# Patient Record
Sex: Female | Born: 1955 | Race: Black or African American | Hispanic: No | Marital: Single | Smoking: Current every day smoker
Health system: Southern US, Community
[De-identification: ages and names within clinical notes are randomized; demographics above are authoritative.]

## PROBLEM LIST (undated history)

## (undated) DIAGNOSIS — I10 Essential (primary) hypertension: Secondary | ICD-10-CM

## (undated) DIAGNOSIS — J449 Chronic obstructive pulmonary disease, unspecified: Secondary | ICD-10-CM

## (undated) HISTORY — PX: ENDOSCOPIC STENT PLACEMENT W/ MLB: SHX1507

---

## 2007-11-29 ENCOUNTER — Emergency Department (HOSPITAL_BASED_OUTPATIENT_CLINIC_OR_DEPARTMENT_OTHER): Admission: EM | Admit: 2007-11-29 | Discharge: 2007-11-29 | Payer: Self-pay | Admitting: Emergency Medicine

## 2009-03-13 ENCOUNTER — Emergency Department (HOSPITAL_BASED_OUTPATIENT_CLINIC_OR_DEPARTMENT_OTHER): Admission: EM | Admit: 2009-03-13 | Discharge: 2009-03-13 | Payer: Self-pay | Admitting: Emergency Medicine

## 2009-03-13 ENCOUNTER — Ambulatory Visit: Payer: Self-pay | Admitting: Interventional Radiology

## 2009-12-18 ENCOUNTER — Emergency Department (HOSPITAL_BASED_OUTPATIENT_CLINIC_OR_DEPARTMENT_OTHER): Admission: EM | Admit: 2009-12-18 | Discharge: 2009-12-18 | Payer: Self-pay | Admitting: Emergency Medicine

## 2009-12-18 ENCOUNTER — Ambulatory Visit: Payer: Self-pay | Admitting: Diagnostic Radiology

## 2010-04-25 LAB — WET PREP, GENITAL: Yeast Wet Prep HPF POC: NONE SEEN

## 2010-04-25 LAB — PROTIME-INR
INR: 1.88 — ABNORMAL HIGH (ref 0.00–1.49)
Prothrombin Time: 21.8 seconds — ABNORMAL HIGH (ref 11.6–15.2)

## 2010-04-25 LAB — BASIC METABOLIC PANEL
BUN: 11 mg/dL (ref 6–23)
Calcium: 9.8 mg/dL (ref 8.4–10.5)
Creatinine, Ser: 0.8 mg/dL (ref 0.4–1.2)
GFR calc Af Amer: >60 mL/min (ref >60)
GFR calc non Af Amer: >60 mL/min (ref >60)

## 2010-04-25 LAB — CBC
Platelets: 292 10*3/uL (ref 150–400)
RBC: 4.98 MIL/uL (ref 3.87–5.11)
RDW: 13.1 % (ref 11.5–15.5)
WBC: 10.9 10*3/uL — ABNORMAL HIGH (ref 4.0–10.5)

## 2010-04-25 LAB — DIFFERENTIAL
Basophils Absolute: 0.1 10*3/uL (ref 0.0–0.1)
Eosinophils Relative: 1 % (ref 0–5)
Lymphocytes Relative: 25 % (ref 12–46)
Lymphs Abs: 2.7 10*3/uL (ref 0.7–4.0)
Neutro Abs: 7.4 10*3/uL (ref 1.7–7.7)
Neutrophils Relative %: 68 % (ref 43–77)

## 2010-04-25 LAB — URINALYSIS, ROUTINE W REFLEX MICROSCOPIC
Bilirubin Urine: NEGATIVE
Hgb urine dipstick: NEGATIVE
Ketones, ur: NEGATIVE mg/dL
Ketones, ur: NEGATIVE mg/dL
Nitrite: NEGATIVE
Nitrite: NEGATIVE
Protein, ur: NEGATIVE mg/dL
Specific Gravity, Urine: 1.014 (ref 1.005–1.030)
Urobilinogen, UA: 0.2 mg/dL (ref 0.0–1.0)

## 2010-04-25 LAB — PREGNANCY, URINE: Preg Test, Ur: NEGATIVE

## 2010-04-25 LAB — GC/CHLAMYDIA PROBE AMP, GENITAL: Chlamydia, DNA Probe: NEGATIVE

## 2010-04-25 LAB — URINE MICROSCOPIC-ADD ON

## 2010-05-01 LAB — CBC
HCT: 41 % (ref 36.0–46.0)
Hemoglobin: 13.8 g/dL (ref 12.0–15.0)
MCHC: 33.5 g/dL (ref 30.0–36.0)
MCV: 88 fL (ref 78.0–100.0)
Platelets: 261 10*3/uL (ref 150–400)
RBC: 4.67 MIL/uL (ref 3.87–5.11)
RDW: 13.9 % (ref 11.5–15.5)
WBC: 13.5 10*3/uL — ABNORMAL HIGH (ref 4.0–10.5)

## 2010-05-01 LAB — BASIC METABOLIC PANEL WITH GFR
Calcium: 9 mg/dL (ref 8.4–10.5)
Creatinine, Ser: 0.8 mg/dL (ref 0.4–1.2)
GFR calc Af Amer: >60 mL/min (ref >60)
GFR calc non Af Amer: >60 mL/min (ref >60)
Glucose, Bld: 110 mg/dL — ABNORMAL HIGH (ref 70–99)
Sodium: 140 meq/L (ref 135–145)

## 2010-05-01 LAB — POCT I-STAT 3, VENOUS BLOOD GAS (G3P V)
Bicarbonate: 24.1 meq/L — ABNORMAL HIGH (ref 20.0–24.0)
O2 Saturation: 82 %
Patient temperature: 101.2
TCO2: 25 mmol/L (ref 0–100)
pCO2, Ven: 39.5 mmHg — ABNORMAL LOW (ref 45.0–50.0)
pH, Ven: 7.399 — ABNORMAL HIGH (ref 7.250–7.300)
pO2, Ven: 50 mmHg — ABNORMAL HIGH (ref 30.0–45.0)

## 2010-05-01 LAB — DIFFERENTIAL
Basophils Absolute: 0.1 K/uL (ref 0.0–0.1)
Basophils Relative: 1 % (ref 0–1)
Eosinophils Absolute: 0 10*3/uL (ref 0.0–0.7)
Eosinophils Relative: 0 % (ref 0–5)
Lymphocytes Relative: 9 % — ABNORMAL LOW (ref 12–46)
Lymphs Abs: 1.2 10*3/uL (ref 0.7–4.0)
Monocytes Absolute: 0.7 K/uL (ref 0.1–1.0)
Monocytes Relative: 6 % (ref 3–12)
Neutro Abs: 11.5 K/uL — ABNORMAL HIGH (ref 1.7–7.7)
Neutrophils Relative %: 85 % — ABNORMAL HIGH (ref 43–77)

## 2010-05-01 LAB — CULTURE, BLOOD (ROUTINE X 2)
Culture: NO GROWTH
Culture: NO GROWTH

## 2010-05-01 LAB — BASIC METABOLIC PANEL
BUN: 10 mg/dL (ref 6–23)
CO2: 29 mEq/L (ref 19–32)
Chloride: 105 mEq/L (ref 96–112)
Potassium: 3.8 mEq/L (ref 3.5–5.1)

## 2010-05-01 LAB — LACTIC ACID, PLASMA: Lactic Acid, Venous: 2 mmol/L (ref 0.5–2.2)

## 2010-12-17 ENCOUNTER — Emergency Department (HOSPITAL_BASED_OUTPATIENT_CLINIC_OR_DEPARTMENT_OTHER)
Admission: EM | Admit: 2010-12-17 | Discharge: 2010-12-17 | Disposition: A | Discharge status: Home / Self Care | Payer: BC Managed Care – PPO | Attending: Emergency Medicine | Admitting: Emergency Medicine

## 2010-12-17 ENCOUNTER — Encounter: Payer: Self-pay | Admitting: *Deleted

## 2010-12-17 DIAGNOSIS — I10 Essential (primary) hypertension: Secondary | ICD-10-CM | POA: Insufficient documentation

## 2010-12-17 DIAGNOSIS — F17200 Nicotine dependence, unspecified, uncomplicated: Secondary | ICD-10-CM | POA: Insufficient documentation

## 2010-12-17 DIAGNOSIS — E119 Type 2 diabetes mellitus without complications: Secondary | ICD-10-CM | POA: Insufficient documentation

## 2010-12-17 DIAGNOSIS — M25539 Pain in unspecified wrist: Secondary | ICD-10-CM | POA: Insufficient documentation

## 2010-12-17 DIAGNOSIS — M79609 Pain in unspecified limb: Secondary | ICD-10-CM | POA: Insufficient documentation

## 2010-12-17 DIAGNOSIS — M25532 Pain in left wrist: Secondary | ICD-10-CM

## 2010-12-17 DIAGNOSIS — Z79899 Other long term (current) drug therapy: Secondary | ICD-10-CM | POA: Insufficient documentation

## 2010-12-17 HISTORY — DX: Essential (primary) hypertension: I10

## 2010-12-17 MED ORDER — IBUPROFEN 600 MG PO TABS: 600.0000 mg | ORAL_TABLET | Freq: Four times a day (QID) | ORAL | Status: AC | PRN

## 2010-12-17 MED ORDER — OXYCODONE-ACETAMINOPHEN 5-325 MG PO TABS: 2.0000 | ORAL_TABLET | ORAL | Status: AC | PRN

## 2010-12-17 MED ORDER — IBUPROFEN 600 MG PO TABS: 600.0000 mg | ORAL_TABLET | Freq: Four times a day (QID) | ORAL | Status: DC | PRN

## 2010-12-17 NOTE — ED Provider Notes (Signed)
History  Scribed for Lisa Baker, MD, the patient was seen in [facility]. The chart was scribed by Gilman Schmidt. The patients care was started at 11:16 PM.   CSN: [id number] Arrival date & time: 12/17/2010 10:54 PM   First MD Initiated Contact with Patient 12/17/10 2310      Chief Complaint  Patient presents with  . Hand Pain     HPI Lisa Zamora is a 55 y.o. female with a history of HTN and DM who presents to the Emergency Department complaining of sharp left hand pain. Pt states she woke up a.m. With left hand pain. No known injury Symptoms are exacerbated upon movement. Denies any history of gout. Denies any injury or fever. Pt has never had similar sx. There are no other associated symptoms and no other alleviating or aggravating factors. Patient notes that she has been doing excessive typing recently and feels that this may be an associated factor    Past Medical History  Diagnosis Date  . Hypertension   . Diabetes mellitus     Past Surgical History  Procedure Date  . Endoscopic stent placement w/ mlb     History reviewed. No pertinent family history.  History  Substance Use Topics  . Smoking status: Current Everyday Smoker  . Smokeless tobacco: Not on file  . Alcohol Use: No    OB History    Grav Para Term Preterm Abortions TAB SAB Ect Mult Living                  Review of Systems  Constitutional: Negative for fever.  Musculoskeletal:       Hand pain   All other systems reviewed and are negative.    Allergies  Review of patient's allergies indicates no known allergies.  Home Medications   Current Outpatient Rx  Name Route Sig Dispense Refill  . ACETAMINOPHEN 500 MG PO TABS Oral Take 1,000 mg by mouth once as needed. For pain     . ALBUTEROL 90 MCG/ACT IN AERS Inhalation Inhale 2 puffs into the lungs every 6 (six) hours as needed. For wheezing or shortness of breath     . AMLODIPINE BESYLATE PO Oral Take 1 tablet by mouth daily.      . ASPIRIN 325  MG PO TBEC Oral Take 325 mg by mouth daily.      Marland Kitchen CLOPIDOGREL BISULFATE 75 MG PO TABS Oral Take 75 mg by mouth daily.      Marland Kitchen GABAPENTIN PO Oral Take 4 capsules by mouth at bedtime.      Marland Kitchen HYDROCHLOROTHIAZIDE PO Oral Take 1 tablet by mouth daily.      Marland Kitchen BIOFREEZE EX Apply externally Apply 1 application topically once as needed. For pain     . METFORMIN HCL PO Oral Take 1 tablet by mouth daily.      . IBUPROFEN 600 MG PO TABS Oral Take 1 tablet (600 mg total) by mouth every 6 (six) hours as needed for pain. 30 tablet 0  . OXYCODONE-ACETAMINOPHEN 5-325 MG PO TABS Oral Take 2 tablets by mouth every 4 (four) hours as needed for pain. 10 tablet 0    BP 172/96  Pulse 106  Temp(Src) 97.9 F (36.6 C) (Oral)  Resp 20  Ht 5\' 3"  (1.6 m)  Wt 160 lb (72.576 kg)  BMI 28.34 kg/m2  SpO2 95%  Physical Exam  Constitutional: She is oriented to person, place, and time. She appears well-developed and well-nourished.  Non-toxic appearance. She does  not have a sickly appearance.  HENT:  Head: Normocephalic and atraumatic.  Eyes: Conjunctivae, EOM and lids are normal. Pupils are equal, round, and reactive to light. No scleral icterus.  Neck: Trachea normal and normal range of motion. Neck supple.  Cardiovascular: Regular rhythm and normal heart sounds.   Pulmonary/Chest: Effort normal and breath sounds normal.  Abdominal: Soft. Normal appearance. There is no tenderness. There is no rebound, no guarding and no CVA tenderness.  Musculoskeletal:       Tenderness w/ dorsal wrist Pain w/ flexion/extension No erythema   Neurological: She is alert and oriented to person, place, and time. She has normal strength.  Skin: Skin is warm, dry and intact. No rash noted.    ED Course  Procedures  DIAGNOSTIC STUDIES: Oxygen Saturation is 95% on room air, adequate by my interpretation.      MDM  Patient to be treated with splint and pain medication and followup with Dr. as needed  I personally performed the  services described in this documentation, which was scribed in my presence. The recorded information has been reviewed and considered.        Lisa Baker, MD 12/17/10 2330

## 2010-12-17 NOTE — ED Notes (Signed)
Pt states she woke up a.m. With left hand pain. No known injury.

## 2015-05-15 ENCOUNTER — Emergency Department (HOSPITAL_BASED_OUTPATIENT_CLINIC_OR_DEPARTMENT_OTHER)
Admission: EM | Admit: 2015-05-15 | Discharge: 2015-05-15 | Disposition: A | Discharge status: Home / Self Care | Payer: BC Managed Care – PPO | Attending: Emergency Medicine | Admitting: Emergency Medicine

## 2015-05-15 ENCOUNTER — Emergency Department (HOSPITAL_BASED_OUTPATIENT_CLINIC_OR_DEPARTMENT_OTHER): Payer: BC Managed Care – PPO

## 2015-05-15 ENCOUNTER — Encounter (HOSPITAL_BASED_OUTPATIENT_CLINIC_OR_DEPARTMENT_OTHER): Payer: Self-pay | Admitting: Emergency Medicine

## 2015-05-15 DIAGNOSIS — Z7982 Long term (current) use of aspirin: Secondary | ICD-10-CM | POA: Diagnosis not present

## 2015-05-15 DIAGNOSIS — Z7902 Long term (current) use of antithrombotics/antiplatelets: Secondary | ICD-10-CM | POA: Insufficient documentation

## 2015-05-15 DIAGNOSIS — F17200 Nicotine dependence, unspecified, uncomplicated: Secondary | ICD-10-CM | POA: Diagnosis not present

## 2015-05-15 DIAGNOSIS — E119 Type 2 diabetes mellitus without complications: Secondary | ICD-10-CM | POA: Diagnosis not present

## 2015-05-15 DIAGNOSIS — Z79899 Other long term (current) drug therapy: Secondary | ICD-10-CM | POA: Insufficient documentation

## 2015-05-15 DIAGNOSIS — Z7984 Long term (current) use of oral hypoglycemic drugs: Secondary | ICD-10-CM | POA: Insufficient documentation

## 2015-05-15 DIAGNOSIS — I10 Essential (primary) hypertension: Secondary | ICD-10-CM | POA: Insufficient documentation

## 2015-05-15 DIAGNOSIS — M25532 Pain in left wrist: Secondary | ICD-10-CM | POA: Diagnosis present

## 2015-05-15 MED ORDER — HYDROCODONE-ACETAMINOPHEN 5-325 MG PO TABS: 1.0000 | ORAL_TABLET | ORAL | Status: DC | PRN

## 2015-05-15 MED ORDER — IBUPROFEN 600 MG PO TABS: 600.0000 mg | ORAL_TABLET | Freq: Four times a day (QID) | ORAL | Status: DC | PRN

## 2015-05-15 MED ORDER — IBUPROFEN 600 MG PO TABS
600.0000 mg | ORAL_TABLET | Freq: Four times a day (QID) | ORAL | Status: DC | PRN
Start: 2015-05-15 — End: 2015-05-15

## 2015-05-15 MED ORDER — IBUPROFEN 400 MG PO TABS
600.0000 mg | ORAL_TABLET | Freq: Once | ORAL | Status: AC
  Administered 2015-05-15: 600 mg via ORAL
  Filled 2015-05-15: qty 1

## 2015-05-15 MED ORDER — OXYCODONE-ACETAMINOPHEN 5-325 MG PO TABS: 1.0000 | ORAL_TABLET | Freq: Four times a day (QID) | ORAL | Status: DC | PRN

## 2015-05-15 NOTE — ED Provider Notes (Signed)
CSN: [id number]     Arrival date & time 05/15/15  1100 History   First MD Initiated Contact with Patient 05/15/15 1107     Chief Complaint  Patient presents with  . Wrist Pain     (Consider location/radiation/quality/duration/timing/severity/associated sxs/prior Treatment) Patient is a 60 y.o. female presenting with wrist pain. The history is provided by the patient and medical records. No language interpreter was used.  Wrist Pain Associated symptoms include arthralgias and myalgias. Pertinent negatives include no abdominal pain, congestion, coughing, fever, headaches or joint swelling.  Ellarae Nevitt is a 60 y.o. female  with a PMH of HTN, DM who presents to the Emergency Department complaining of acute onset of worsening, achy left wrist pain with radiation to middle fingers and forearm musculature that began yesterday afternoon. No medications taken prior to arrival for symptoms. Denies injury or trauma. Denies swelling, change in color, fever, recent illness. 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   Past Medical History  Diagnosis Date  . Hypertension   . Diabetes mellitus    Past Surgical History  Procedure Laterality Date  . Endoscopic stent placement w/ mlb     History reviewed. No pertinent family history. Social History  Substance Use Topics  . Smoking status: Current Every Day Smoker  . Smokeless tobacco: None  . Alcohol Use: No   OB History    No data available     Review of Systems  Constitutional: Negative for fever.  HENT: Negative for congestion.   Eyes: Negative for visual disturbance.  Respiratory: Negative for cough and shortness of breath.   Cardiovascular: Negative.   Gastrointestinal: Negative for abdominal pain.  Genitourinary: Negative for dysuria.  Musculoskeletal: Positive for myalgias and arthralgias. Negative for joint swelling.  Skin: Negative for color change.  Neurological: Negative for dizziness and  headaches.      Allergies  Review of patient's allergies indicates no known allergies.  Home Medications   Prior to Admission medications   Medication Sig Start Date End Date Taking? Authorizing Provider  acetaminophen (TYLENOL) 500 MG tablet Take 1,000 mg by mouth once as needed. For pain     Historical Provider, MD  albuterol (PROVENTIL,VENTOLIN) 90 MCG/ACT inhaler Inhale 2 puffs into the lungs every 6 (six) hours as needed. For wheezing or shortness of breath     Historical Provider, MD  AMLODIPINE BESYLATE PO Take 1 tablet by mouth daily.      Historical Provider, MD  aspirin 325 MG EC tablet Take 325 mg by mouth daily.      Historical Provider, MD  clopidogrel (PLAVIX) 75 MG tablet Take 75 mg by mouth daily.      Historical Provider, MD  GABAPENTIN PO Take 4 capsules by mouth at bedtime.      Historical Provider, MD  HYDROCHLOROTHIAZIDE PO Take 1 tablet by mouth daily.      Historical Provider, MD  HYDROcodone-acetaminophen (NORCO/VICODIN) 5-325 MG tablet Take 1 tablet by mouth every 4 (four) hours as needed. 05/15/15   Chase Picket Jaelin Devincentis, PA-C  ibuprofen (ADVIL,MOTRIN) 600 MG tablet Take 1 tablet (600 mg total) by mouth every 6 (six) hours as needed. 05/15/15   Chase Picket Derrill Bagnell, PA-C  Menthol, Topical Analgesic, (BIOFREEZE EX) Apply 1 application topically once as needed. For pain     Historical Provider, MD  METFORMIN HCL PO Take 1 tablet by mouth daily.      Historical Provider, MD   BP 130/72 mmHg  Pulse 94  Temp(Src) 98.6 F (37 C) (Oral)  Resp 18  Ht 5\' 3"  (1.6 m)  Wt 72.576 kg  BMI 28.35 kg/m2  SpO2 100% Physical Exam  Constitutional: She is oriented to person, place, and time. She appears well-developed and well-nourished.  Alert and in no acute distress  HENT:  Head: Normocephalic and atraumatic.  Cardiovascular: Normal rate, regular rhythm and normal heart sounds.  Exam reveals no gallop and no friction rub.   No murmur heard. Pulmonary/Chest: Effort normal and  breath sounds normal. No respiratory distress. She has no wheezes. She has no rales. She exhibits no tenderness.  Musculoskeletal:  No gross deformity noted. Decreased range of motion secondary to pain. Pain with flexion/extension. There is no joint effusion noted. No erythema or warmth overlaying the joint. There is tenderness to palpation over the dorsal wrist. The patient has normal sensation and motor function in the median, ulnar, and radial nerve distributions. There is no anatomic snuff box tenderness. 2+ Radial pulse.  Neurological: She is alert and oriented to person, place, and time.  Skin: Skin is warm and dry.  Nursing note and vitals reviewed.   ED Course  Procedures (including critical care time) Labs Review Labs Reviewed - No data to display  Imaging Review Dg Wrist Complete Left  05/15/2015  CLINICAL DATA:  Acute left wrist pain without known injury. EXAM: LEFT WRIST - COMPLETE 3+ VIEW COMPARISON:  December 18, 2010. FINDINGS: There is no evidence of fracture or dislocation. There is no evidence of arthropathy or other focal bone abnormality. Soft tissues are unremarkable. IMPRESSION: Normal left wrist. Electronically Signed   By: Lupita RaiderJames  Green Jr, M.D.   On: 05/15/2015 11:51   I have personally reviewed and evaluated these images and lab results as part of my medical decision-making.   EKG Interpretation None      MDM   Final diagnoses:  Left wrist pain   Galvin ProfferDonna Meriweather  presents with acute onset of left wrist pain without injury yesterday. Hx of the same once in the past where she was told she had pseudogout. On exam, patient with tenderness to the dorsal wrist. Pain with wrist flexion/extension. No erythema, warmth or swelling of the joint. X-rays were obtained which were unremarkable. Will treat with short course of pain meds and ibuprofen. Wrist splinted in ED. PCP or ortho follow up. Return precautions and home care discussed. All questions answered.   [facility] SystemJaime Pilcher  Esperanza Madrazo, PA-C 05/15/15 1238  Linwood DibblesJon Knapp, MD 05/16/15 [phone]

## 2015-05-15 NOTE — ED Notes (Signed)
Onset yesterday with left wrist pain, no trauma. +radiating to elbow and fingers.   Hx of Pseudogout on right wrist 3 years ago. States this pain feels the same.

## 2015-05-15 NOTE — Discharge Instructions (Signed)
1. Medications: Ibuprofen for mild/moderate pain, take pain medication only as needed for severe pain- This can make you very drowsy - please do not drink or drive on this medication, continue usual home medications 2. Treatment: rest, drink plenty of fluids, ice affected area 3. Follow Up: Please follow up with your primary doctor in 3 days for discussion of your diagnoses and further evaluation after today's visit; Please return to the ER for new or worsening symptoms, any additional concerns.  COLD THERAPY DIRECTIONS:  Ice or gel packs can be used to reduce both pain and swelling. Ice is the most helpful within the first 24 to 48 hours after an injury or flareup from overusing a muscle or joint.  Ice is effective, has very few side effects, and is safe for most people to use.   If you expose your skin to cold temperatures for too long or without the proper protection, you can damage your skin or nerves. Watch for signs of skin damage due to cold.   HOME CARE INSTRUCTIONS  Follow these tips to use ice and cold packs safely.  Place a dry or damp towel between the ice and skin. A damp towel will cool the skin more quickly, so you may need to shorten the time that the ice is used.  For a more rapid response, add gentle compression to the ice.  Ice for no more than 10 to 20 minutes at a time. The bonier the area you are icing, the less time it will take to get the benefits of ice.  Check your skin after 5 minutes to make sure there are no signs of a poor response to cold or skin damage.  Rest 20 minutes or more in between uses.  Once your skin is numb, you can end your treatment. You can test numbness by very lightly touching your skin. The touch should be so light that you do not see the skin dimple from the pressure of your fingertip. When using ice, most people will feel these normal sensations in this order: cold, burning, aching, and numbness.

## 2015-09-15 DIAGNOSIS — I6523 Occlusion and stenosis of bilateral carotid arteries: Secondary | ICD-10-CM | POA: Diagnosis present

## 2015-09-15 DIAGNOSIS — M5116 Intervertebral disc disorders with radiculopathy, lumbar region: Secondary | ICD-10-CM | POA: Insufficient documentation

## 2015-09-15 DIAGNOSIS — I70219 Atherosclerosis of native arteries of extremities with intermittent claudication, unspecified extremity: Secondary | ICD-10-CM | POA: Insufficient documentation

## 2015-10-30 ENCOUNTER — Encounter (HOSPITAL_BASED_OUTPATIENT_CLINIC_OR_DEPARTMENT_OTHER): Payer: Self-pay | Admitting: Emergency Medicine

## 2015-10-30 DIAGNOSIS — X500XXA Overexertion from strenuous movement or load, initial encounter: Secondary | ICD-10-CM | POA: Diagnosis not present

## 2015-10-30 DIAGNOSIS — J449 Chronic obstructive pulmonary disease, unspecified: Secondary | ICD-10-CM | POA: Diagnosis not present

## 2015-10-30 DIAGNOSIS — S39012A Strain of muscle, fascia and tendon of lower back, initial encounter: Secondary | ICD-10-CM | POA: Insufficient documentation

## 2015-10-30 DIAGNOSIS — Z79899 Other long term (current) drug therapy: Secondary | ICD-10-CM | POA: Insufficient documentation

## 2015-10-30 DIAGNOSIS — F17210 Nicotine dependence, cigarettes, uncomplicated: Secondary | ICD-10-CM | POA: Diagnosis not present

## 2015-10-30 DIAGNOSIS — Y929 Unspecified place or not applicable: Secondary | ICD-10-CM | POA: Diagnosis not present

## 2015-10-30 DIAGNOSIS — Y999 Unspecified external cause status: Secondary | ICD-10-CM | POA: Diagnosis not present

## 2015-10-30 DIAGNOSIS — Z791 Long term (current) use of non-steroidal anti-inflammatories (NSAID): Secondary | ICD-10-CM | POA: Insufficient documentation

## 2015-10-30 DIAGNOSIS — I10 Essential (primary) hypertension: Secondary | ICD-10-CM | POA: Diagnosis not present

## 2015-10-30 DIAGNOSIS — Z7984 Long term (current) use of oral hypoglycemic drugs: Secondary | ICD-10-CM | POA: Insufficient documentation

## 2015-10-30 DIAGNOSIS — E119 Type 2 diabetes mellitus without complications: Secondary | ICD-10-CM | POA: Insufficient documentation

## 2015-10-30 DIAGNOSIS — Y939 Activity, unspecified: Secondary | ICD-10-CM | POA: Insufficient documentation

## 2015-10-30 DIAGNOSIS — S3992XA Unspecified injury of lower back, initial encounter: Secondary | ICD-10-CM | POA: Diagnosis present

## 2015-10-30 NOTE — ED Triage Notes (Signed)
Patient reports mid back pain which began Friday.  Unsure if she injured herself but reports history of back pain.

## 2015-10-31 ENCOUNTER — Emergency Department (HOSPITAL_BASED_OUTPATIENT_CLINIC_OR_DEPARTMENT_OTHER)
Admission: EM | Admit: 2015-10-31 | Discharge: 2015-10-31 | Disposition: A | Discharge status: Home / Self Care | Payer: BC Managed Care – PPO | Attending: Emergency Medicine | Admitting: Emergency Medicine

## 2015-10-31 DIAGNOSIS — S39012A Strain of muscle, fascia and tendon of lower back, initial encounter: Secondary | ICD-10-CM

## 2015-10-31 HISTORY — DX: Chronic obstructive pulmonary disease, unspecified: J44.9

## 2015-10-31 MED ORDER — ACETAMINOPHEN 500 MG PO TABS
1000.0000 mg | ORAL_TABLET | Freq: Once | ORAL | Status: AC
  Administered 2015-10-31: 1000 mg via ORAL
  Filled 2015-10-31: qty 2

## 2015-10-31 MED ORDER — TRAMADOL HCL 50 MG PO TABS: 50.0000 mg | ORAL_TABLET | Freq: Two times a day (BID) | ORAL | 0 refills | Status: DC | PRN

## 2015-10-31 MED ORDER — KETOROLAC TROMETHAMINE 30 MG/ML IJ SOLN
30.0000 mg | Freq: Once | INTRAMUSCULAR | Status: AC
  Administered 2015-10-31: 30 mg via INTRAVENOUS
  Filled 2015-10-31: qty 1

## 2015-10-31 MED ORDER — ONDANSETRON HCL 4 MG/2ML IJ SOLN
4.0000 mg | Freq: Once | INTRAMUSCULAR | Status: AC
  Administered 2015-10-31: 4 mg via INTRAVENOUS
  Filled 2015-10-31: qty 2

## 2015-10-31 MED ORDER — MORPHINE SULFATE (PF) 4 MG/ML IV SOLN
4.0000 mg | Freq: Once | INTRAVENOUS | Status: AC
  Administered 2015-10-31: 4 mg via INTRAVENOUS
  Filled 2015-10-31: qty 1

## 2015-10-31 NOTE — ED Provider Notes (Signed)
[facility] Provider Note   CSN: [id number] Arrival date & time: 10/30/15  2257 By signing my name below, I, Bridgette Habermann, attest that this documentation has been prepared under the direction and in the presence of Tomasita Crumble, MD. Electronically Signed: Bridgette Habermann, ED Scribe. 10/31/15. 12:55 AM.  History   Chief Complaint Chief Complaint  Patient presents with  . Back Pain   HPI Comments: Lisa Zamora is a 60 y.o. female with h/o DM and HTN who presents to the Emergency Department complaining of 8/10, burning, aching mid back pain onset three days ago, worsening two days ago. Pt is unsure if she injured her back but does have h/o back pain. She notes pain is exacerbated with movement. Pt has taken Aleve with mild relief to the pain. Pt denies dysuria, urinary and bowel incontinence, fever, or any other associated symptoms.  The history is provided by the patient. No language interpreter was used.    Past Medical History:  Diagnosis Date  . COPD (chronic obstructive pulmonary disease) (HCC)   . Diabetes mellitus   . Hypertension     There are no active problems to display for this patient.   Past Surgical History:  Procedure Laterality Date  . ENDOSCOPIC STENT PLACEMENT W/ MLB      OB History    No data available       Home Medications    Prior to Admission medications   Medication Sig Start Date End Date Taking? Authorizing Provider  Budesonide-Formoterol Fumarate (SYMBICORT IN) Inhale into the lungs.   Yes Historical Provider, MD  pioglitazone (ACTOS) 15 MG tablet Take 15 mg by mouth daily.   Yes Historical Provider, MD  acetaminophen (TYLENOL) 500 MG tablet Take 1,000 mg by mouth once as needed. For pain     Historical Provider, MD  albuterol (PROVENTIL,VENTOLIN) 90 MCG/ACT inhaler Inhale 2 puffs into the lungs every 6 (six) hours as needed. For wheezing or shortness of breath     Historical Provider, MD  AMLODIPINE BESYLATE PO Take 1 tablet by mouth daily.       Historical Provider, MD  aspirin 325 MG EC tablet Take 325 mg by mouth daily.      Historical Provider, MD  clopidogrel (PLAVIX) 75 MG tablet Take 75 mg by mouth daily.      Historical Provider, MD  GABAPENTIN PO Take 4 capsules by mouth at bedtime.      Historical Provider, MD  HYDROCHLOROTHIAZIDE PO Take 1 tablet by mouth daily.      Historical Provider, MD  HYDROcodone-acetaminophen (NORCO/VICODIN) 5-325 MG tablet Take 1 tablet by mouth every 4 (four) hours as needed. 05/15/15   Chase Picket Ward, PA-C  ibuprofen (ADVIL,MOTRIN) 600 MG tablet Take 1 tablet (600 mg total) by mouth every 6 (six) hours as needed. 05/15/15   Chase Picket Ward, PA-C  Menthol, Topical Analgesic, (BIOFREEZE EX) Apply 1 application topically once as needed. For pain     Historical Provider, MD  METFORMIN HCL PO Take 1 tablet by mouth daily.      Historical Provider, MD  oxyCODONE-acetaminophen (PERCOCET/ROXICET) 5-325 MG tablet Take 1 tablet by mouth every 6 (six) hours as needed for severe pain. 05/15/15   Chase Picket Ward, PA-C    Family History History reviewed. No pertinent family history.  Social History Social History  Substance Use Topics  . Smoking status: Current Every Day Smoker    Packs/day: 1.00    Types: Cigarettes  . Smokeless tobacco: Never Used  .  Alcohol use Yes     Allergies   Hydrocodone   Review of Systems Review of Systems 10 Systems reviewed and all are negative for acute change except as noted in the HPI. Physical Exam Updated Vital Signs BP 162/75 (BP Location: Right Arm)   Pulse 93   Temp 97.9 F (36.6 C) (Oral)   Resp 18   Ht 5\' 3"  (1.6 m)   Wt 165 lb (74.8 kg)   SpO2 98%   BMI 29.23 kg/m   Physical Exam  Constitutional: She is oriented to person, place, and time. She appears well-developed and well-nourished. No distress.  HENT:  Head: Normocephalic and atraumatic.  Nose: Nose normal.  Mouth/Throat: Oropharynx is clear and moist. No oropharyngeal exudate.    Eyes: Conjunctivae and EOM are normal. Pupils are equal, round, and reactive to light. No scleral icterus.  Neck: Normal range of motion. Neck supple. No JVD present. No tracheal deviation present. No thyromegaly present.  Cardiovascular: Normal rate, regular rhythm and normal heart sounds.  Exam reveals no gallop and no friction rub.   No murmur heard. Pulmonary/Chest: Effort normal and breath sounds normal. No respiratory distress. She has no wheezes. She exhibits no tenderness.  Abdominal: Soft. Bowel sounds are normal. She exhibits no distension and no mass. There is no tenderness. There is no rebound and no guarding.  Musculoskeletal: Normal range of motion. She exhibits tenderness. She exhibits no edema.  Normal strength and sensation in all extremities. Normal cerebellar testing. No point tenderness in back.  Lymphadenopathy:    She has no cervical adenopathy.  Neurological: She is alert and oriented to person, place, and time. No cranial nerve deficit. She exhibits normal muscle tone.  Skin: Skin is warm and dry. No rash noted. No erythema. No pallor.  Nursing note and vitals reviewed.    ED Treatments / Results  DIAGNOSTIC STUDIES: Oxygen Saturation is 98% on RA, normal by my interpretation.    COORDINATION OF CARE: 12:55 AM Discussed treatment plan with pt at bedside which includes pain medicine and pt agreed to plan.  Labs (all labs ordered are listed, but only abnormal results are displayed) Labs Reviewed - No data to display  EKG  EKG Interpretation None       Radiology No results found.  Procedures Procedures (including critical care time)  Medications Ordered in ED Medications - No data to display   Initial Impression / Assessment and Plan / ED Course  I have reviewed the triage vital signs and the nursing notes.  Pertinent labs & imaging results that were available during my care of the patient were reviewed by me and considered in my medical decision  making (see chart for details).  Clinical Course    Patient presents to the ED for back pain.  She does not admit to red flag symptoms such as trauma, h/o cancer or IVDA, weight loss or night sweats.  It got worse after heavy lifting.  She was given morphine, toradol and tylenol in the ED.  She has follow up with her doctor and this is being worked up.  She feels better after medications.  Plan to DC with tramadol for severe pain.  Neuro exam remains normal.  She appears well and in NAD.  VS remain within her normal limits and she is safe for DC.  Final Clinical Impressions(s) / ED Diagnoses   Final diagnoses:  None     I personally performed the services described in this documentation, which was scribed  in my presence. The recorded information has been reviewed and is accurate.     New Prescriptions New Prescriptions   No medications on file     Tomasita Crumble, MD 10/31/15 0301

## 2016-07-27 DIAGNOSIS — I739 Peripheral vascular disease, unspecified: Secondary | ICD-10-CM | POA: Diagnosis present

## 2016-09-07 DIAGNOSIS — M545 Low back pain, unspecified: Secondary | ICD-10-CM | POA: Insufficient documentation

## 2017-11-14 ENCOUNTER — Encounter (HOSPITAL_BASED_OUTPATIENT_CLINIC_OR_DEPARTMENT_OTHER): Payer: Self-pay | Admitting: Emergency Medicine

## 2017-11-14 ENCOUNTER — Other Ambulatory Visit: Payer: Self-pay

## 2017-11-14 ENCOUNTER — Emergency Department (HOSPITAL_BASED_OUTPATIENT_CLINIC_OR_DEPARTMENT_OTHER): Payer: BC Managed Care – PPO

## 2017-11-14 ENCOUNTER — Emergency Department (HOSPITAL_BASED_OUTPATIENT_CLINIC_OR_DEPARTMENT_OTHER)
Admission: EM | Admit: 2017-11-14 | Discharge: 2017-11-14 | Disposition: A | Discharge status: Home / Self Care | Payer: BC Managed Care – PPO | Attending: Emergency Medicine | Admitting: Emergency Medicine

## 2017-11-14 DIAGNOSIS — Z7984 Long term (current) use of oral hypoglycemic drugs: Secondary | ICD-10-CM | POA: Insufficient documentation

## 2017-11-14 DIAGNOSIS — J441 Chronic obstructive pulmonary disease with (acute) exacerbation: Secondary | ICD-10-CM

## 2017-11-14 DIAGNOSIS — Z79899 Other long term (current) drug therapy: Secondary | ICD-10-CM | POA: Insufficient documentation

## 2017-11-14 DIAGNOSIS — Z7982 Long term (current) use of aspirin: Secondary | ICD-10-CM | POA: Insufficient documentation

## 2017-11-14 DIAGNOSIS — I10 Essential (primary) hypertension: Secondary | ICD-10-CM | POA: Insufficient documentation

## 2017-11-14 DIAGNOSIS — F17210 Nicotine dependence, cigarettes, uncomplicated: Secondary | ICD-10-CM | POA: Diagnosis not present

## 2017-11-14 DIAGNOSIS — E119 Type 2 diabetes mellitus without complications: Secondary | ICD-10-CM | POA: Insufficient documentation

## 2017-11-14 DIAGNOSIS — R05 Cough: Secondary | ICD-10-CM | POA: Diagnosis present

## 2017-11-14 LAB — CBC WITH DIFFERENTIAL/PLATELET
Basophils Absolute: 0 10*3/uL (ref 0.0–0.1)
Basophils Relative: 0 %
Eosinophils Absolute: 0 10*3/uL (ref 0.0–0.7)
Eosinophils Relative: 0 %
HCT: 41.6 % (ref 36.0–46.0)
Hemoglobin: 13.9 g/dL (ref 12.0–15.0)
Lymphocytes Relative: 14 %
Lymphs Abs: 2.1 10*3/uL (ref 0.7–4.0)
MCH: 28.5 pg (ref 26.0–34.0)
MCHC: 33.4 g/dL (ref 30.0–36.0)
MCV: 85.4 fL (ref 78.0–100.0)
Monocytes Absolute: 1.5 10*3/uL — ABNORMAL HIGH (ref 0.1–1.0)
Monocytes Relative: 10 %
Neutro Abs: 11.3 10*3/uL — ABNORMAL HIGH (ref 1.7–7.7)
Neutrophils Relative %: 76 %
Platelets: 347 10*3/uL (ref 150–400)
RBC: 4.87 MIL/uL (ref 3.87–5.11)
RDW: 14.9 % (ref 11.5–15.5)
WBC: 14.9 10*3/uL — ABNORMAL HIGH (ref 4.0–10.5)

## 2017-11-14 LAB — COMPREHENSIVE METABOLIC PANEL
ALT: 20 U/L (ref 0–44)
AST: 21 U/L (ref 15–41)
Albumin: 3.7 g/dL (ref 3.5–5.0)
Alkaline Phosphatase: 85 U/L (ref 38–126)
Anion gap: 11 (ref 5–15)
BUN: 13 mg/dL (ref 8–23)
CO2: 25 mmol/L (ref 22–32)
Calcium: 9.3 mg/dL (ref 8.9–10.3)
Chloride: 98 mmol/L (ref 98–111)
Creatinine, Ser: 0.94 mg/dL (ref 0.44–1.00)
GFR calc Af Amer: >60 mL/min (ref >60)
GFR calc non Af Amer: >60 mL/min (ref >60)
Glucose, Bld: 158 mg/dL — ABNORMAL HIGH (ref 70–99)
Potassium: 3.6 mmol/L (ref 3.5–5.1)
Sodium: 134 mmol/L — ABNORMAL LOW (ref 135–145)
Total Bilirubin: 0.5 mg/dL (ref 0.3–1.2)
Total Protein: 7.3 g/dL (ref 6.5–8.1)

## 2017-11-14 LAB — URINALYSIS, ROUTINE W REFLEX MICROSCOPIC
Bilirubin Urine: NEGATIVE
Glucose, UA: NEGATIVE mg/dL
Hgb urine dipstick: NEGATIVE
Ketones, ur: NEGATIVE mg/dL
Leukocytes, UA: NEGATIVE
Nitrite: NEGATIVE
Protein, ur: NEGATIVE mg/dL
Specific Gravity, Urine: 1.015 (ref 1.005–1.030)
pH: 6 (ref 5.0–8.0)

## 2017-11-14 LAB — I-STAT CG4 LACTIC ACID, ED: Lactic Acid, Venous: 1.76 mmol/L (ref 0.5–1.9)

## 2017-11-14 MED ORDER — IPRATROPIUM-ALBUTEROL 0.5-2.5 (3) MG/3ML IN SOLN
3.0000 mL | Freq: Four times a day (QID) | RESPIRATORY_TRACT | Status: DC
  Administered 2017-11-14: 3 mL via RESPIRATORY_TRACT
  Filled 2017-11-14: qty 3

## 2017-11-14 MED ORDER — DEXAMETHASONE 4 MG PO TABS: 4.0000 mg | ORAL_TABLET | Freq: Two times a day (BID) | ORAL | 0 refills | Status: DC

## 2017-11-14 MED ORDER — AZITHROMYCIN 250 MG PO TABS: 250.0000 mg | ORAL_TABLET | Freq: Every day | ORAL | 0 refills | Status: DC

## 2017-11-14 NOTE — ED Triage Notes (Signed)
Per daughter patient with coughing, sneezing, sore throat, swollen eyes since Sunday.  Reports fevers.

## 2017-11-24 NOTE — ED Provider Notes (Signed)
[facility] EMERGENCY DEPARTMENT Provider Note   CSN: [id number] Arrival date & time: 11/14/17  1518     History   Chief Complaint Chief Complaint  Patient presents with  . Cough    HPI Lisa Zamora is a 62 y.o. female.  HPI   62 year old female with cough, sneezing, sore throat and facial congestion.  Symptom onset Sunday.  Persistent since then.  Subjective fevers.  History of COPD.  Some improvement of symptoms with albuterol.  Past Medical History:  Diagnosis Date  . COPD (chronic obstructive pulmonary disease) (HCC)   . Diabetes mellitus   . Hypertension     There are no active problems to display for this patient.   Past Surgical History:  Procedure Laterality Date  . ENDOSCOPIC STENT PLACEMENT W/ MLB       OB History   None      Home Medications    Prior to Admission medications   Medication Sig Start Date End Date Taking? Authorizing Provider  acetaminophen (TYLENOL) 500 MG tablet Take 1,000 mg by mouth once as needed. For pain     [provider]  albuterol (PROVENTIL,VENTOLIN) 90 MCG/ACT inhaler Inhale 2 puffs into the lungs every 6 (six) hours as needed. For wheezing or shortness of breath     [provider]  AMLODIPINE BESYLATE PO Take 1 tablet by mouth daily.      [provider]  aspirin 325 MG EC tablet Take 325 mg by mouth daily.      [provider]  azithromycin (ZITHROMAX) 250 MG tablet Take 1 tablet (250 mg total) by mouth daily. Take first 2 tablets together, then 1 every day until finished. 11/14/17   Raeford Razor, MD  Budesonide-Formoterol Fumarate (SYMBICORT IN) Inhale into the lungs.    [provider]  clopidogrel (PLAVIX) 75 MG tablet Take 75 mg by mouth daily.      [provider]  dexamethasone (DECADRON) 4 MG tablet Take 1 tablet (4 mg total) by mouth 2 (two) times daily. 11/14/17   Raeford Razor, MD  GABAPENTIN PO Take 4 capsules by mouth at bedtime.      [provider]  HYDROCHLOROTHIAZIDE PO Take 1 tablet by mouth daily.      [provider]  HYDROcodone-acetaminophen (NORCO/VICODIN) 5-325 MG tablet Take 1 tablet by mouth every 4 (four) hours as needed. 05/15/15   Ward, Chase Picket, PA-C  ibuprofen (ADVIL,MOTRIN) 600 MG tablet Take 1 tablet (600 mg total) by mouth every 6 (six) hours as needed. 05/15/15   Ward, Chase Picket, PA-C  Menthol, Topical Analgesic, (BIOFREEZE EX) Apply 1 application topically once as needed. For pain     [provider]  METFORMIN HCL PO Take 1 tablet by mouth daily.      [provider]  oxyCODONE-acetaminophen (PERCOCET/ROXICET) 5-325 MG tablet Take 1 tablet by mouth every 6 (six) hours as needed for severe pain. 05/15/15   Ward, Chase Picket, PA-C  pioglitazone (ACTOS) 15 MG tablet Take 15 mg by mouth daily.    [provider]  traMADol (ULTRAM) 50 MG tablet Take 1 tablet (50 mg total) by mouth every 12 (twelve) hours as needed for severe pain. 10/31/15   Tomasita Crumble, MD    Family History History reviewed. No pertinent family history.  Social History Social History   Tobacco Use  . Smoking status: Current Every Day Smoker    Packs/day: 1.00    Types: Cigarettes  . Smokeless tobacco: Never  Used  Substance Use Topics  . Alcohol use: Yes  . Drug use: No     Allergies   Hydrocodone   Review of Systems Review of Systems  All systems reviewed and negative, other than as noted in HPI.  Physical Exam Updated Vital Signs BP (!) 149/66 (BP Location: Left Arm)   Pulse 95   Temp 98.2 F (36.8 C) (Oral)   Resp 20   Ht 5\' 3"  (1.6 m)   Wt 70.3 kg   SpO2 93%   BMI 27.46 kg/m   Physical Exam  Constitutional: She appears well-developed and well-nourished. No distress.  HENT:  Head: Normocephalic and atraumatic.  Eyes: Conjunctivae are normal. Right eye exhibits no discharge. Left eye exhibits no discharge.  Neck: Neck supple.  Cardiovascular: Normal rate, regular  rhythm and normal heart sounds. Exam reveals no gallop and no friction rub.  No murmur heard. Pulmonary/Chest: Effort normal. No respiratory distress. She has wheezes.  Abdominal: Soft. She exhibits no distension. There is no tenderness.  Musculoskeletal: She exhibits no edema or tenderness.  Lower extremities symmetric as compared to each other. No calf tenderness. Negative Homan's. No palpable cords.   Neurological: She is alert.  Skin: Skin is warm and dry.  Psychiatric: She has a normal mood and affect. Her behavior is normal. Thought content normal.  Nursing note and vitals reviewed.    ED Treatments / Results  Labs (all labs ordered are listed, but only abnormal results are displayed) Labs Reviewed  COMPREHENSIVE METABOLIC PANEL - Abnormal; Notable for the following components:      Result Value   Sodium 134 (*)    Glucose, Bld 158 (*)    All other components within normal limits  CBC WITH DIFFERENTIAL/PLATELET - Abnormal; Notable for the following components:   WBC 14.9 (*)    Neutro Abs 11.3 (*)    Monocytes Absolute 1.5 (*)    All other components within normal limits  URINALYSIS, ROUTINE W REFLEX MICROSCOPIC  I-STAT CG4 LACTIC ACID, ED    EKG EKG Interpretation  Date/Time:  Thursday November 14 2017 16:48:13 EDT Ventricular Rate:  96 PR Interval:    QRS Duration: 88 QT Interval:  353 QTC Calculation: 447 R Axis:   66 Text Interpretation:  Sinus rhythm Anteroseptal infarct, old Repol abnrm suggests ischemia, anterolateral Confirmed by Blane Ohara [phone]) on 11/15/2017 1:07:42 PM   Radiology No results found.   Dg Chest 2 View  Result Date: 11/14/2017 CLINICAL DATA:  Cough and congestion with fever EXAM: CHEST - 2 VIEW COMPARISON:  March 13, 2009 chest radiograph and December 18, 2009 chest CT FINDINGS: Lungs are clear. Heart size and pulmonary vascularity are normal. No adenopathy. No pneumothorax. No bone lesions. IMPRESSION: No edema or consolidation.  Electronically Signed   By: Bretta Bang III M.D.   On: 11/14/2017 15:44    Procedures Procedures (including critical care time)  Medications Ordered in ED Medications - No data to display   Initial Impression / Assessment and Plan / ED Course  I have reviewed the triage vital signs and the nursing notes.  Pertinent labs & imaging results that were available during my care of the patient were reviewed by me and considered in my medical decision making (see chart for details).     COPD exacerbation. CXR w/o focal infiltrate, edema or pneumo. Steroids. Abx. Close outpt FU.   It has been determined that no acute conditions requiring further emergency intervention are present at this time. The  patient has been advised of the diagnosis and plan. I reviewed any labs and imaging including any potential incidental findings. We have discussed signs and symptoms that warrant return to the ED and they are listed in the discharge instructions.    Final Clinical Impressions(s) / ED Diagnoses   Final diagnoses:  COPD exacerbation [facility])    ED Discharge Orders         Ordered    dexamethasone (DECADRON) 4 MG tablet  2 times daily     11/14/17 1800    azithromycin (ZITHROMAX) 250 MG tablet  Daily     11/14/17 1800           Raeford Razor, MD 11/24/17 1747

## 2019-06-22 DIAGNOSIS — I10 Essential (primary) hypertension: Secondary | ICD-10-CM | POA: Diagnosis present

## 2019-06-22 DIAGNOSIS — J449 Chronic obstructive pulmonary disease, unspecified: Secondary | ICD-10-CM | POA: Insufficient documentation

## 2019-06-22 DIAGNOSIS — E119 Type 2 diabetes mellitus without complications: Secondary | ICD-10-CM

## 2020-02-04 DIAGNOSIS — F32A Depression, unspecified: Secondary | ICD-10-CM | POA: Diagnosis present

## 2020-02-04 DIAGNOSIS — G4733 Obstructive sleep apnea (adult) (pediatric): Secondary | ICD-10-CM | POA: Diagnosis present

## 2020-02-04 DIAGNOSIS — E782 Mixed hyperlipidemia: Secondary | ICD-10-CM | POA: Diagnosis present

## 2020-02-08 ENCOUNTER — Ambulatory Visit: Payer: BC Managed Care – PPO

## 2020-02-09 ENCOUNTER — Ambulatory Visit: Payer: Self-pay | Attending: Internal Medicine

## 2020-02-09 DIAGNOSIS — Z23 Encounter for immunization: Secondary | ICD-10-CM

## 2020-02-09 NOTE — Progress Notes (Signed)
° °  Covid-19 Vaccination Clinic  Name:  Carianna Lague    MRN: [id number] DOB: 08/20/1955  02/09/2020  Ms. Pollitt was observed post Covid-19 immunization for 15 minutes without incident. She was provided with Vaccine Information Sheet and instruction to access the V-Safe system.   Ms. Paulding was instructed to call 911 with any severe reactions post vaccine:  Difficulty breathing   Swelling of face and throat   A fast heartbeat   A bad rash all over body   Dizziness and weakness   Immunizations Administered    Name Date Dose VIS Date Route   Pfizer COVID-19 Vaccine 02/09/2020  2:56 PM 0.3 mL 12/02/2019 Intramuscular   Manufacturer: [employer], [employer]   Lot: [id number]   NDC: [address]-8

## 2020-02-18 ENCOUNTER — Other Ambulatory Visit: Payer: 59

## 2020-02-18 DIAGNOSIS — Z20822 Contact with and (suspected) exposure to covid-19: Secondary | ICD-10-CM

## 2020-02-20 LAB — SARS-COV-2, NAA 2 DAY TAT

## 2020-02-20 LAB — NOVEL CORONAVIRUS, NAA: SARS-CoV-2, NAA: NOT DETECTED

## 2020-12-10 ENCOUNTER — Encounter (HOSPITAL_BASED_OUTPATIENT_CLINIC_OR_DEPARTMENT_OTHER): Payer: Self-pay | Admitting: Emergency Medicine

## 2020-12-10 ENCOUNTER — Emergency Department (HOSPITAL_BASED_OUTPATIENT_CLINIC_OR_DEPARTMENT_OTHER)
Admission: EM | Admit: 2020-12-10 | Discharge: 2020-12-10 | Disposition: A | Discharge status: Home / Self Care | Payer: BC Managed Care – PPO | Attending: Emergency Medicine | Admitting: Emergency Medicine

## 2020-12-10 ENCOUNTER — Other Ambulatory Visit: Payer: Self-pay

## 2020-12-10 ENCOUNTER — Emergency Department (HOSPITAL_BASED_OUTPATIENT_CLINIC_OR_DEPARTMENT_OTHER): Payer: BC Managed Care – PPO

## 2020-12-10 DIAGNOSIS — M25532 Pain in left wrist: Secondary | ICD-10-CM | POA: Insufficient documentation

## 2020-12-10 DIAGNOSIS — Z7982 Long term (current) use of aspirin: Secondary | ICD-10-CM | POA: Diagnosis not present

## 2020-12-10 DIAGNOSIS — I10 Essential (primary) hypertension: Secondary | ICD-10-CM | POA: Diagnosis not present

## 2020-12-10 DIAGNOSIS — W19XXXA Unspecified fall, initial encounter: Secondary | ICD-10-CM | POA: Diagnosis not present

## 2020-12-10 DIAGNOSIS — S62115A Nondisplaced fracture of triquetrum [cuneiform] bone, left wrist, initial encounter for closed fracture: Secondary | ICD-10-CM | POA: Diagnosis not present

## 2020-12-10 DIAGNOSIS — Z79899 Other long term (current) drug therapy: Secondary | ICD-10-CM | POA: Diagnosis not present

## 2020-12-10 DIAGNOSIS — F17210 Nicotine dependence, cigarettes, uncomplicated: Secondary | ICD-10-CM | POA: Insufficient documentation

## 2020-12-10 DIAGNOSIS — E119 Type 2 diabetes mellitus without complications: Secondary | ICD-10-CM | POA: Diagnosis not present

## 2020-12-10 DIAGNOSIS — Z7984 Long term (current) use of oral hypoglycemic drugs: Secondary | ICD-10-CM | POA: Insufficient documentation

## 2020-12-10 DIAGNOSIS — S6992XA Unspecified injury of left wrist, hand and finger(s), initial encounter: Secondary | ICD-10-CM | POA: Diagnosis present

## 2020-12-10 DIAGNOSIS — J449 Chronic obstructive pulmonary disease, unspecified: Secondary | ICD-10-CM | POA: Diagnosis not present

## 2020-12-10 DIAGNOSIS — Z7951 Long term (current) use of inhaled steroids: Secondary | ICD-10-CM | POA: Insufficient documentation

## 2020-12-10 MED ORDER — ACETAMINOPHEN 325 MG PO TABS
650.0000 mg | ORAL_TABLET | Freq: Once | ORAL | Status: AC
  Administered 2020-12-10: 650 mg via ORAL
  Filled 2020-12-10: qty 2

## 2020-12-10 MED ORDER — OXYCODONE HCL 5 MG PO TABS: 2.5000 mg | ORAL_TABLET | Freq: Four times a day (QID) | ORAL | 0 refills | Status: AC | PRN

## 2020-12-10 MED ORDER — OXYCODONE-ACETAMINOPHEN 5-325 MG PO TABS: 1.0000 | ORAL_TABLET | ORAL | Status: DC | PRN

## 2020-12-10 NOTE — ED Triage Notes (Signed)
Pt states has been drinking and fell, injured left hand and wrist. Took 600 advil at home.

## 2020-12-10 NOTE — ED Provider Notes (Signed)
[facility] EMERGENCY DEPARTMENT Provider Note  CSN: [id number] Arrival date & time: 12/10/20 4098  Chief Complaint(s) Fall and Wrist Pain  HPI Lisa Zamora is a 65 y.o. female who presents to the emergency department with left hand pain following mechanical fall early in the evening.  Patient reports that she was walking on the sidewalk and lost her balance causing her to fall forward and landed on her hands.  Initially she felt a mild ache in her left wrist which continued to worsen throughout the night.  That prompted her visit.  Pain is now severe throbbing worse with range of motion and palpation.  She has mild swelling to the distal wrist.  Alleviated by mobility.  Patient reports that she was drinking at that time but denies loss of consciousness.  Currently has no headache.  She is not anticoagulated.  She denies any neck pain or back pain.  No chest pain or shortness of breath.  No abdominal pain.  No hip pain or other extremity pain.  The history is provided by the patient.   Past Medical History Past Medical History:  Diagnosis Date   COPD (chronic obstructive pulmonary disease) (HCC)    Diabetes mellitus    Hypertension    There are no problems to display for this patient.  Home Medication(s) Prior to Admission medications   Medication Sig Start Date End Date Taking? Authorizing Provider  oxyCODONE (ROXICODONE) 5 MG immediate release tablet Take 0.5-1 tablets (2.5-5 mg total) by mouth every 6 (six) hours as needed for up to 5 days for severe pain. 12/10/20 12/15/20 Yes Makaela Cando, Amadeo Garnet, MD  acetaminophen (TYLENOL) 500 MG tablet Take 1,000 mg by mouth once as needed. For pain     [provider]  albuterol (PROVENTIL,VENTOLIN) 90 MCG/ACT inhaler Inhale 2 puffs into the lungs every 6 (six) hours as needed. For wheezing or shortness of breath     [provider]  AMLODIPINE BESYLATE PO Take 1 tablet by mouth daily.      [provider]   aspirin 325 MG EC tablet Take 325 mg by mouth daily.      [provider]  azithromycin (ZITHROMAX) 250 MG tablet Take 1 tablet (250 mg total) by mouth daily. Take first 2 tablets together, then 1 every day until finished. 11/14/17   Raeford Razor, MD  Budesonide-Formoterol Fumarate (SYMBICORT IN) Inhale into the lungs.    [provider]  clopidogrel (PLAVIX) 75 MG tablet Take 75 mg by mouth daily.      [provider]  dexamethasone (DECADRON) 4 MG tablet Take 1 tablet (4 mg total) by mouth 2 (two) times daily. 11/14/17   Raeford Razor, MD  GABAPENTIN PO Take 4 capsules by mouth at bedtime.      [provider]  HYDROCHLOROTHIAZIDE PO Take 1 tablet by mouth daily.      [provider]  HYDROcodone-acetaminophen (NORCO/VICODIN) 5-325 MG tablet Take 1 tablet by mouth every 4 (four) hours as needed. 05/15/15   Ward, Chase Picket, PA-C  ibuprofen (ADVIL,MOTRIN) 600 MG tablet Take 1 tablet (600 mg total) by mouth every 6 (six) hours as needed. 05/15/15   Ward, Chase Picket, PA-C  Menthol, Topical Analgesic, (BIOFREEZE EX) Apply 1 application topically once as needed. For pain     [provider]  METFORMIN HCL PO Take 1 tablet by mouth daily.      [provider]  oxyCODONE-acetaminophen (PERCOCET/ROXICET) 5-325 MG tablet Take 1 tablet by mouth  every 6 (six) hours as needed for severe pain. 05/15/15   Ward, Chase Picket, PA-C  pioglitazone (ACTOS) 15 MG tablet Take 15 mg by mouth daily.    [provider]  traMADol (ULTRAM) 50 MG tablet Take 1 tablet (50 mg total) by mouth every 12 (twelve) hours as needed for severe pain. 10/31/15   Tomasita Crumble, MD                                                                                                                                    Past Surgical History Past Surgical History:  Procedure Laterality Date   ENDOSCOPIC STENT PLACEMENT W/ MLB     Family History History reviewed. No  pertinent family history.  Social History Social History   Tobacco Use   Smoking status: Every Day    Packs/day: 1.00    Types: Cigarettes   Smokeless tobacco: Never  Substance Use Topics   Alcohol use: Yes   Drug use: No   Allergies Hydrocodone  Review of Systems Review of Systems All other systems are reviewed and are negative for acute change except as noted in the HPI  Physical Exam Vital Signs  I have reviewed the triage vital signs BP (!) 155/70 (BP Location: Right Arm)   Pulse 74   Temp 98.2 F (36.8 C) (Oral)   Resp 19   SpO2 100%   Physical Exam Vitals reviewed.  Constitutional:      General: She is not in acute distress.    Appearance: She is well-developed. She is not diaphoretic.  HENT:     Head: Normocephalic and atraumatic.     Right Ear: External ear normal.     Left Ear: External ear normal.     Nose: Nose normal.  Eyes:     General: No scleral icterus.       Right eye: No discharge.        Left eye: No discharge.     Conjunctiva/sclera: Conjunctivae normal.     Pupils: Pupils are equal, round, and reactive to light.  Neck:     Trachea: Phonation normal.  Cardiovascular:     Rate and Rhythm: Normal rate and regular rhythm.     Pulses:          Radial pulses are 2+ on the right side and 2+ on the left side.       Dorsalis pedis pulses are 2+ on the right side and 2+ on the left side.     Heart sounds: Normal heart sounds. No murmur heard.   No friction rub. No gallop.  Pulmonary:     Effort: Pulmonary effort is normal. No respiratory distress.     Breath sounds: Normal breath sounds. No stridor. No wheezing.  Abdominal:     General: There is no distension.     Palpations: Abdomen is soft.     Tenderness: There is no abdominal tenderness.  Musculoskeletal:     Right wrist: Normal pulse.     Left wrist: Swelling, tenderness, bony tenderness and snuff box tenderness present. No deformity or lacerations. Decreased range of motion. Normal  pulse.       Hands:     Cervical back: Normal range of motion and neck supple. No bony tenderness.     Thoracic back: No bony tenderness.     Lumbar back: No bony tenderness.     Comments: Clavicles stable. Chest stable to AP/Lat compression. Pelvis stable to Lat compression. No obvious extremity deformity. No chest or abdominal wall contusion.  Skin:    General: Skin is warm and dry.     Findings: No erythema or rash.  Neurological:     Mental Status: She is alert and oriented to person, place, and time.     Comments: Moving all extremities  Psychiatric:        Behavior: Behavior normal.    ED Results and Treatments Labs (all labs ordered are listed, but only abnormal results are displayed) Labs Reviewed - No data to display                                                                                                                       EKG  EKG Interpretation  Date/Time:    Ventricular Rate:    PR Interval:    QRS Duration:   QT Interval:    QTC Calculation:   R Axis:     Text Interpretation:         Radiology DG Wrist Complete Left  Result Date: 12/10/2020 CLINICAL DATA:  Fall, left wrist pain EXAM: LEFT WRIST - COMPLETE 3+ VIEW COMPARISON:  None. FINDINGS: There is an acute, minimally displaced fracture of likely the dorsal triquetrum with mild overlying soft tissue swelling. No other fracture identified. There is diastasis of the distal radioulnar joint which may relate to ligamentous injury. No associated fracture in this location. Otherwise normal alignment. IMPRESSION: Possible minimally displaced avulsion fracture of the dorsal triquetrum and diastasis of the distal radioulnar joint. Mild dorsal soft tissue swelling. Electronically Signed   By: Helyn Numbers M.D.   On: 12/10/2020 01:54   DG Hand Complete Left  Result Date: 12/10/2020 CLINICAL DATA:  Fall, left hand pain EXAM: LEFT HAND - COMPLETE 3+ VIEW COMPARISON:  None. FINDINGS: Normal alignment.  there is an acute, minimally displaced anatomically aligned fracture of the a triquetrum best seen on lateral examination with mild overlying soft tissue swelling. No other fracture or dislocation. Joint spaces are preserved. IMPRESSION: Minimally displaced fracture likely representing an avulsion fracture of the dorsal triquetrum with mild overlying soft tissue swelling. Electronically Signed   By: Helyn Numbers M.D.   On: 12/10/2020 01:51    Pertinent labs & imaging results that were available during my care of the patient were reviewed by me and considered in my medical decision making (see MDM for details).  Medications Ordered in ED Medications  acetaminophen (TYLENOL) tablet  650 mg (650 mg Oral Given 12/10/20 0108)                                                                                                                                     Procedures .Splint Application  Date/Time: 12/10/2020 6:20 AM Performed by: Nira Conn, MD Authorized by: Nira Conn, MD   Consent:    Consent obtained:  Verbal   Consent given by:  Patient   Risks discussed:  Discoloration, numbness and pain   Alternatives discussed:  Delayed treatment Universal protocol:    Procedure explained and questions answered to patient or proxy's satisfaction: yes     Patient identity confirmed:  Verbally with patient Pre-procedure details:    Distal neurologic exam:  Normal Procedure details:    Location:  Wrist   Wrist location:  L wrist   Splint type:  Thumb spica   Supplies:  Fiberglass Post-procedure details:    Distal neurologic exam:  Normal   Procedure completion:  Tolerated well, no immediate complications  (including critical care time)  Medical Decision Making / ED Course I have reviewed the nursing notes for this encounter and the patient's prior records (if available in EHR or on provided paperwork).  Lisa Zamora was evaluated in Emergency Department on 12/10/2020  for the symptoms described in the history of present illness. She was evaluated in the context of the global COVID-19 pandemic, which necessitated consideration that the patient might be at risk for infection with the SARS-CoV-2 virus that causes COVID-19. Institutional protocols and algorithms that pertain to the evaluation of patients at risk for COVID-19 are in a state of rapid change based on information released by regulatory bodies including the CDC and federal and state organizations. These policies and algorithms were followed during the patient's care in the ED.     Mechanical fall resulting in left triquetrum fracture. Patient with snuffbox tenderness.  Possible scaphoid fracture. Incident occurred almost 10 hours prior to arrival.  She has no other injuries noted on exam requiring imaging.  She denies any headache that would be concerning for ICH.  No other imaging required at this time. Patient splinted. Recommended orthopedic follow-up.  Pertinent labs & imaging results that were available during my care of the patient were reviewed by me and considered in my medical decision making:    Final Clinical Impression(s) / ED Diagnoses Final diagnoses:  Fall, initial encounter  Closed nondisplaced fracture of triquetrum of left wrist, initial encounter   The patient appears reasonably screened and/or stabilized for discharge and I doubt any other medical condition or other [facility] requiring further screening, evaluation, or treatment in the ED at this time prior to discharge. Safe for discharge with strict return precautions.  Disposition: Discharge  Condition: Good  I have discussed the results, Dx and Tx plan with the patient/family who expressed understanding and agree(s) with the plan. Discharge instructions discussed at  length. The patient/family was given strict return precautions who verbalized understanding of the instructions. No further questions at time of discharge.    ED  Discharge Orders          Ordered    oxyCODONE (ROXICODONE) 5 MG immediate release tablet  Every 6 hours PRN        12/10/20 5993            [facility] narcotic database reviewed and no active prescriptions noted.   Follow Up: Bjorn Pippin, MD [address] [place] [address] [phone]  Call  to schedule an appointment for close follow up     This chart was dictated using voice recognition software.  Despite best efforts to proofread,  errors can occur which can change the documentation meaning.    Nira Conn, MD 12/10/20 [phone]

## 2020-12-10 NOTE — Discharge Instructions (Addendum)
For pain control you may take at 1000 mg of Tylenol every 8 hours scheduled.  In addition you can take 0.5 to 1 tablet of Oxycodone every 6 hours as needed for pain not controlled with the scheduled Tylenol. Do not take Oxycodone if you have change in mental status from its use.

## 2021-09-02 DIAGNOSIS — R413 Other amnesia: Secondary | ICD-10-CM | POA: Insufficient documentation

## 2021-10-13 DIAGNOSIS — R4189 Other symptoms and signs involving cognitive functions and awareness: Secondary | ICD-10-CM | POA: Diagnosis present

## 2021-10-13 DIAGNOSIS — F3340 Major depressive disorder, recurrent, in remission, unspecified: Secondary | ICD-10-CM | POA: Insufficient documentation

## 2022-01-22 ENCOUNTER — Encounter (HOSPITAL_BASED_OUTPATIENT_CLINIC_OR_DEPARTMENT_OTHER): Payer: Self-pay | Admitting: Pediatrics

## 2022-01-22 ENCOUNTER — Emergency Department (HOSPITAL_BASED_OUTPATIENT_CLINIC_OR_DEPARTMENT_OTHER)
Admission: EM | Admit: 2022-01-22 | Discharge: 2022-01-22 | Disposition: A | Discharge status: Home / Self Care | Payer: Medicare HMO | Attending: Emergency Medicine | Admitting: Emergency Medicine

## 2022-01-22 ENCOUNTER — Other Ambulatory Visit: Payer: Self-pay

## 2022-01-22 ENCOUNTER — Emergency Department (HOSPITAL_BASED_OUTPATIENT_CLINIC_OR_DEPARTMENT_OTHER): Payer: Medicare HMO

## 2022-01-22 DIAGNOSIS — R9431 Abnormal electrocardiogram [ECG] [EKG]: Secondary | ICD-10-CM | POA: Diagnosis not present

## 2022-01-22 DIAGNOSIS — E119 Type 2 diabetes mellitus without complications: Secondary | ICD-10-CM | POA: Diagnosis not present

## 2022-01-22 DIAGNOSIS — D72829 Elevated white blood cell count, unspecified: Secondary | ICD-10-CM | POA: Diagnosis not present

## 2022-01-22 DIAGNOSIS — J441 Chronic obstructive pulmonary disease with (acute) exacerbation: Secondary | ICD-10-CM | POA: Insufficient documentation

## 2022-01-22 DIAGNOSIS — Z20822 Contact with and (suspected) exposure to covid-19: Secondary | ICD-10-CM | POA: Insufficient documentation

## 2022-01-22 DIAGNOSIS — I10 Essential (primary) hypertension: Secondary | ICD-10-CM | POA: Diagnosis not present

## 2022-01-22 DIAGNOSIS — R0602 Shortness of breath: Secondary | ICD-10-CM | POA: Diagnosis present

## 2022-01-22 LAB — COMPREHENSIVE METABOLIC PANEL
ALT: 17 U/L (ref 0–44)
AST: 23 U/L (ref 15–41)
Albumin: 3.8 g/dL (ref 3.5–5.0)
Alkaline Phosphatase: 76 U/L (ref 38–126)
Anion gap: 9 (ref 5–15)
BUN: 10 mg/dL (ref 8–23)
CO2: 24 mmol/L (ref 22–32)
Calcium: 9.1 mg/dL (ref 8.9–10.3)
Chloride: 102 mmol/L (ref 98–111)
Creatinine, Ser: 0.7 mg/dL (ref 0.44–1.00)
GFR, Estimated: >60 mL/min (ref >60)
Glucose, Bld: 134 mg/dL — ABNORMAL HIGH (ref 70–99)
Potassium: 4 mmol/L (ref 3.5–5.1)
Sodium: 135 mmol/L (ref 135–145)
Total Bilirubin: 0.6 mg/dL (ref 0.3–1.2)
Total Protein: 7.5 g/dL (ref 6.5–8.1)

## 2022-01-22 LAB — TROPONIN I (HIGH SENSITIVITY)
Troponin I (High Sensitivity): 7 ng/L (ref <18)
Troponin I (High Sensitivity): 7 ng/L (ref <18)

## 2022-01-22 LAB — URINALYSIS, ROUTINE W REFLEX MICROSCOPIC
Bilirubin Urine: NEGATIVE
Glucose, UA: NEGATIVE mg/dL
Hgb urine dipstick: NEGATIVE
Ketones, ur: NEGATIVE mg/dL
Leukocytes,Ua: NEGATIVE
Nitrite: NEGATIVE
Protein, ur: NEGATIVE mg/dL
Specific Gravity, Urine: 1.015 (ref 1.005–1.030)
pH: 7 (ref 5.0–8.0)

## 2022-01-22 LAB — CBC
HCT: 41.7 % (ref 36.0–46.0)
Hemoglobin: 13.6 g/dL (ref 12.0–15.0)
MCH: 29.8 pg (ref 26.0–34.0)
MCHC: 32.6 g/dL (ref 30.0–36.0)
MCV: 91.2 fL (ref 80.0–100.0)
Platelets: 299 10*3/uL (ref 150–400)
RBC: 4.57 MIL/uL (ref 3.87–5.11)
RDW: 14.3 % (ref 11.5–15.5)
WBC: 10.9 10*3/uL — ABNORMAL HIGH (ref 4.0–10.5)
nRBC: 0 % (ref 0.0–0.2)

## 2022-01-22 LAB — RESP PANEL BY RT-PCR (RSV, FLU A&B, COVID)  RVPGX2
Influenza A by PCR: NEGATIVE
Influenza B by PCR: NEGATIVE
Resp Syncytial Virus by PCR: NEGATIVE
SARS Coronavirus 2 by RT PCR: NEGATIVE

## 2022-01-22 LAB — LIPASE, BLOOD: Lipase: 29 U/L (ref 11–51)

## 2022-01-22 MED ORDER — PREDNISONE 50 MG PO TABS: 50.0000 mg | ORAL_TABLET | Freq: Every day | ORAL | 0 refills | Status: AC

## 2022-01-22 MED ORDER — IPRATROPIUM-ALBUTEROL 0.5-2.5 (3) MG/3ML IN SOLN
3.0000 mL | Freq: Once | RESPIRATORY_TRACT | Status: AC
  Administered 2022-01-22: 3 mL via RESPIRATORY_TRACT
  Filled 2022-01-22: qty 3

## 2022-01-22 NOTE — ED Provider Notes (Signed)
   ED Course / MDM   Clinical Course as of 01/22/22 1643  Mon Jan 22, 2022  1528 Received sign out from Dr. Jacqulyn Bath. Pending delta troponin and breathing treatment. Presenting with ECG changes without chest pain during regular PCP visit. Having some exertional dyspnea but no chest pain.  [WS]  1640 Assessed patient she reports that after breathing treatment she feels back to baseline.  Her delta troponin is negative.  Suspect COPD exacerbation.  Does report sensation of abdominal fullness which is improved but history atypical for intra-abdominal process or vascular catastrophe and labs reassuring.  Doubt ACS, suspect EKG changes could be due to chronic strain.  Her primary doctor is already referred her to cardiology.  Will prescribe course of prednisone. Will discharge patient to home. All questions answered. Patient comfortable with plan of discharge. Return precautions discussed with patient and specified on the after visit summary.  [WS]    Clinical Course User Index [WS] Lonell Grandchild, MD   Medical Decision Making Amount and/or Complexity of Data Reviewed Labs: ordered. Radiology: ordered.  Risk Prescription drug management.          Lonell Grandchild, MD 01/22/22 [phone]

## 2022-01-22 NOTE — ED Provider Notes (Signed)
Emergency Department Provider Note   I have reviewed the triage vital signs and the nursing notes.   HISTORY  Chief Complaint Abdominal Pain   HPI Lisa Zamora is a 66 y.o. female past history of COPD, diabetes, hypertension presents to the emergency department from their primary care physician office with shortness of breath and abnormal EKG.  Patient describes increased shortness of breath over the past 12 hours.  She happened to have a scheduled PCP appointment this AM and mentioned her shortness of breath.  An EKG was done showing T wave inversions and she was referred here.  She used a nebulizer treatment last night and had some relief but that has not been lasting.  No abdominal pain, severe headache, URI symptoms.  She has had some cough which has been mainly dry and not particularly worse compared to her COPD coughing.    Past Medical History:  Diagnosis Date   COPD (chronic obstructive pulmonary disease) ([place])    Diabetes mellitus    Hypertension     Review of Systems  Constitutional: No fever/chills Cardiovascular: Denies chest pain. Respiratory: Positive shortness of breath and cough.  Gastrointestinal: No abdominal pain.  No nausea, no vomiting.  No diarrhea.  No constipation. Genitourinary: Negative for dysuria. Musculoskeletal: Negative for back pain. Skin: Negative for rash. Neurological: Negative for headaches, focal weakness or numbness.  ____________________________________________   PHYSICAL EXAM:  VITAL SIGNS: ED Triage Vitals  Enc Vitals Group     BP 01/22/22 1324 (!) 185/78     Pulse Rate 01/22/22 1324 70     Resp 01/22/22 1324 20     Temp 01/22/22 1324 98.6 F (37 C)     Temp src --      SpO2 01/22/22 1324 97 %     Weight 01/22/22 1324 140 lb (63.5 kg)     Height 01/22/22 1324 5\' 3"  (1.6 m)   Constitutional: Alert and oriented. Well appearing and in no acute distress. Eyes: Conjunctivae are normal.  Head: Atraumatic. Nose: No  congestion/rhinnorhea. Mouth/Throat: Mucous membranes are moist.  Neck: No stridor.  Cardiovascular: Normal rate, regular rhythm. Good peripheral circulation. Grossly normal heart sounds.   Respiratory: Normal respiratory effort.  No retractions. Lungs with faint end-expiratory wheezing.  Gastrointestinal: Soft and nontender. No distention.  Musculoskeletal: No lower extremity tenderness nor edema. No gross deformities of extremities. Neurologic:  Normal speech and language. No gross focal neurologic deficits are appreciated.  Skin:  Skin is warm, dry and intact. No rash noted.  ____________________________________________   LABS (all labs ordered are listed, but only abnormal results are displayed)  Labs Reviewed  COMPREHENSIVE METABOLIC PANEL - Abnormal; Notable for the following components:      Result Value   Glucose, Bld 134 (*)    All other components within normal limits  CBC - Abnormal; Notable for the following components:   WBC 10.9 (*)    All other components within normal limits  RESP PANEL BY RT-PCR (RSV, FLU A&B, COVID)  RVPGX2  LIPASE, BLOOD  URINALYSIS, ROUTINE W REFLEX MICROSCOPIC  TROPONIN I (HIGH SENSITIVITY)  TROPONIN I (HIGH SENSITIVITY)   ____________________________________________  EKG   EKG Interpretation  Date/Time:  Monday January 22 2022 13:29:42 EST Ventricular Rate:  69 PR Interval:  126 QRS Duration: 86 QT Interval:  420 QTC Calculation: 450 R Axis:   81 Text Interpretation: Sinus rhythm with Premature atrial complexes Septal infarct , age undetermined ST & T wave abnormality, consider inferolateral ischemia Abnormal ECG  When compared with ECG of 14-Nov-2017 16:48, PREVIOUS ECG IS PRESENT T wave changes slightly progressed from 2019 tracing Confirmed by Alona Bene [phone]) on 01/22/2022 1:45:26 PM        ____________________________________________  RADIOLOGY  DG Chest 2 View  Result Date: 01/22/2022 CLINICAL DATA:  Cough,  dyspnea EXAM: CHEST - 2 VIEW COMPARISON:  11/14/2017 chest radiograph. FINDINGS: Stable cardiomediastinal silhouette with normal heart size. No pneumothorax. No pleural effusion. Lungs appear clear, with no acute consolidative airspace disease and no pulmonary edema. IMPRESSION: No active cardiopulmonary disease. Electronically Signed   By: Delbert Phenix M.D.   On: 01/22/2022 14:09    ____________________________________________   PROCEDURES  Procedure(s) performed:   Procedures  None  ____________________________________________   INITIAL IMPRESSION / ASSESSMENT AND PLAN / ED COURSE  Pertinent labs & imaging results that were available during my care of the patient were reviewed by me and considered in my medical decision making (see chart for details).   This patient is Presenting for Evaluation of SOB, which does require a range of treatment options, and is a complaint that involves a high risk of morbidity and mortality.  The Differential Diagnoses include COPD exacerbation, viral process, CAP, PE, ACS, etc.  Critical Interventions-    Medications  ipratropium-albuterol (DUONEB) 0.5-2.5 (3) MG/3ML nebulizer solution 3 mL (3 mLs Nebulization Given 01/22/22 1610)    Reassessment after intervention: SOB symptoms improving.    I did obtain Additional Historical Information from daughter at bedside.    Clinical Laboratory Tests Ordered, included CMP with no acute kidney injury, LFT abnormality, electrolyte disturbance.  Very mild leukocytosis on CBC to 10.9.  Lipase normal.  Radiologic Tests Ordered, included CXR. I independently interpreted the images and agree with radiology interpretation.   Cardiac Monitor Tracing which shows NSR.    Social Determinants of Health Risk patient has a smoking history.   Medical Decision Making: Summary:  Presents emergency department with shortness of breath over the past 12 hours which seems most consistent with COPD exacerbation.  Her  EKG does seem slightly worse compared to 2019 tracing but on that tracing we also see T wave inversions.  Lower suspicion for ACS but will obtain troponin.  Reevaluation with update and discussion with plan for repeat troponin and ambulation after neb. Care transferred to Dr. Suezanne Jacquet pending repeat labs.   Considered admission but will trend troponin and reassess after nebs.   Patient's presentation is most consistent with exacerbation of chronic illness.   Disposition: pending   ____________________________________________  FINAL CLINICAL IMPRESSION(S) / ED DIAGNOSES  Final diagnoses:  COPD exacerbation (HCC)     NEW OUTPATIENT MEDICATIONS STARTED DURING THIS VISIT:  Discharge Medication List as of 01/22/2022  5:21 PM     START taking these medications   Details  predniSONE (DELTASONE) 50 MG tablet Take 1 tablet (50 mg total) by mouth daily for 5 days., Starting Mon 01/22/2022, Until Sat 01/27/2022, Normal        Note:  This document was prepared using Dragon voice recognition software and may include unintentional dictation errors.  Alona Bene, MD, [facility] Emergency Medicine    Caley Ciaramitaro, Arlyss Repress, MD 01/23/22 1056

## 2022-01-22 NOTE — ED Triage Notes (Signed)
Reported was at PCP for regular visit and she complaint of "abdominal fullness" and feeling of shortness of activity, worst with walking. Reported was sent here due to concerns of EKG abnormalities. Denies pain when asked.

## 2022-01-22 NOTE — Discharge Instructions (Addendum)
Please follow-up with your primary doctor and cardiology for your abnormal EKG.

## 2022-01-22 NOTE — ED Notes (Signed)
Ambulated on r/a HR 70-74, SpO2 94-97, complaint of feeling full.  After HHN BBS with increased air movement.

## 2022-02-16 IMAGING — CR DG HAND COMPLETE 3+V*L*
3 series · 3 of 3 positions shown · non-contrast
Comparison: None.

CLINICAL DATA: Fall, left hand pain

EXAM:
LEFT HAND - COMPLETE 3+ VIEW

[x hand pa left]
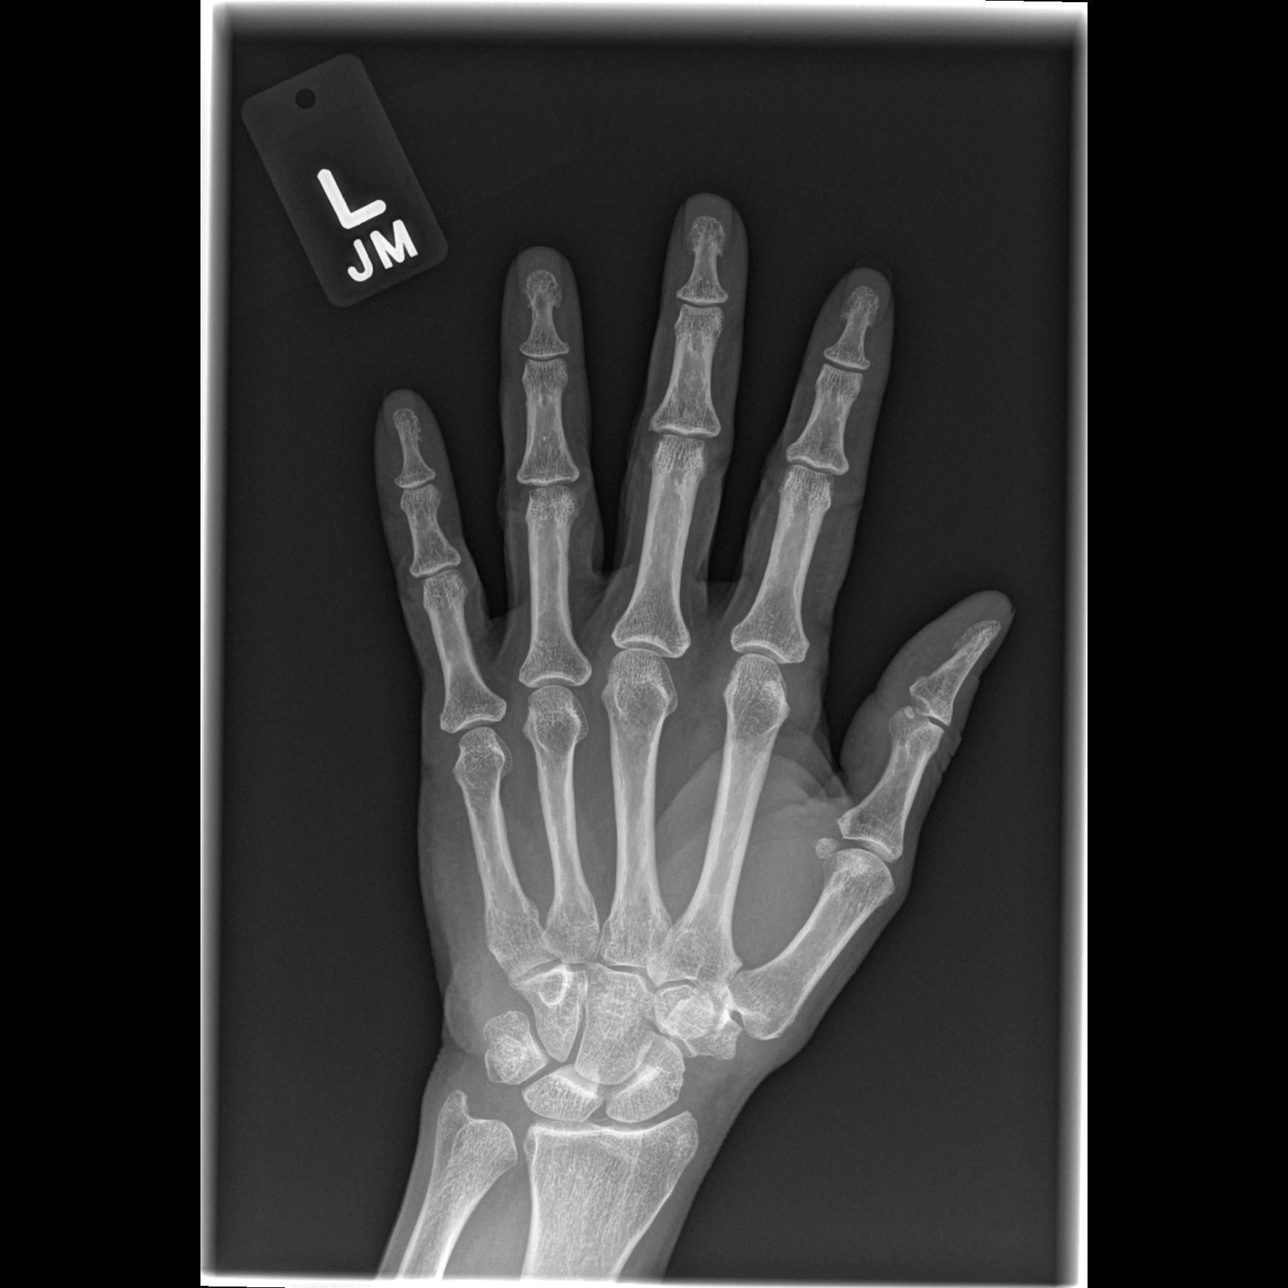

[x hand oblique left]
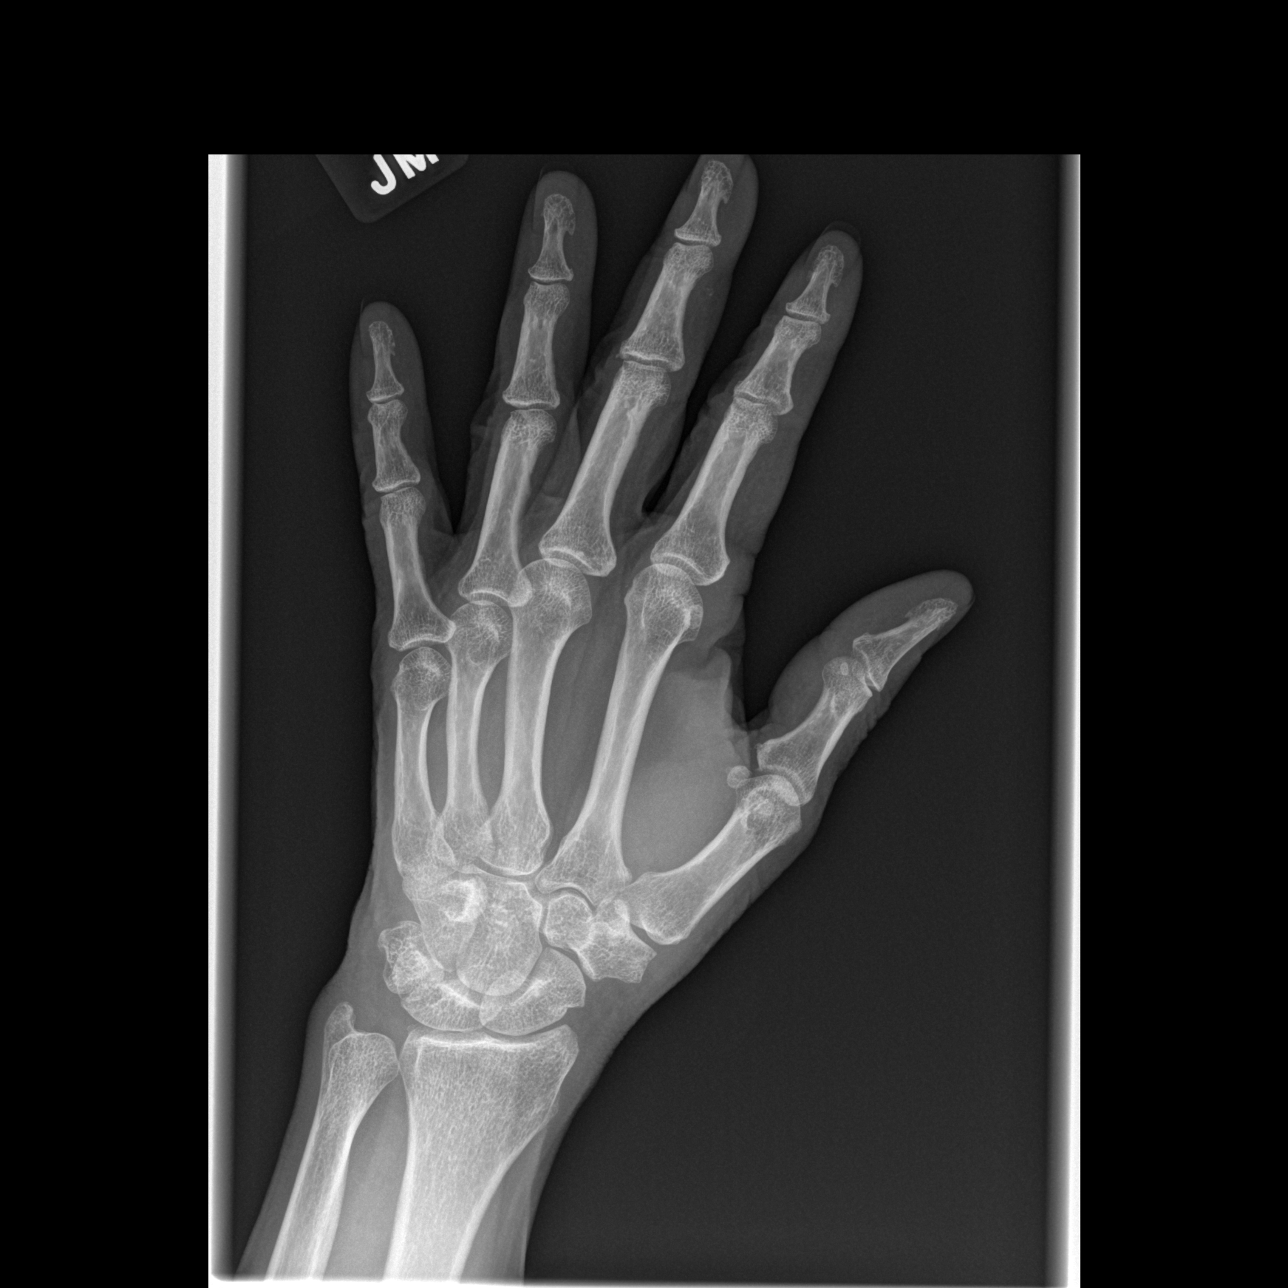

[x hand lat left]
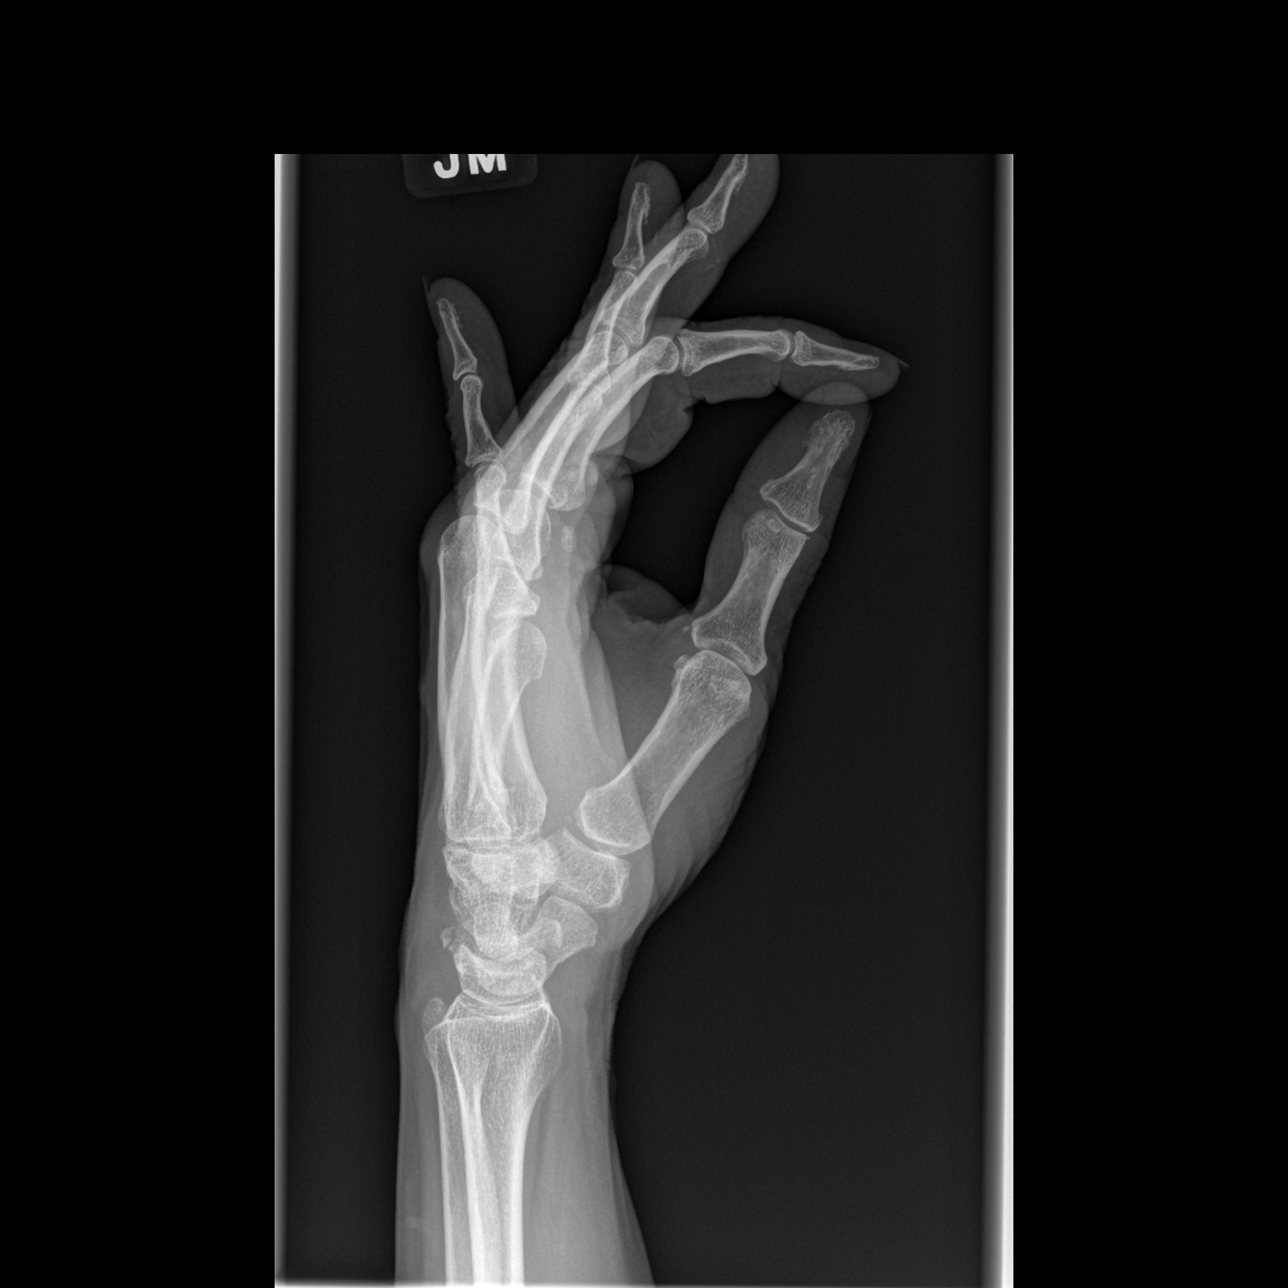

[3 of 3 positions shown; findings below may reference images not displayed]

FINDINGS: Normal alignment. there is an acute, minimally displaced
anatomically aligned fracture of the a triquetrum best seen on
lateral examination with mild overlying soft tissue swelling. No
other fracture or dislocation. Joint spaces are preserved.
IMPRESSION: Minimally displaced fracture likely representing an avulsion
fracture of the dorsal triquetrum with mild overlying soft tissue
swelling.

## 2022-02-16 IMAGING — CR DG WRIST COMPLETE 3+V*L*
4 series · 4 of 4 positions shown · non-contrast
Comparison: None.

CLINICAL DATA: Fall, left wrist pain

EXAM:
LEFT WRIST - COMPLETE 3+ VIEW

[x wrist lat left]
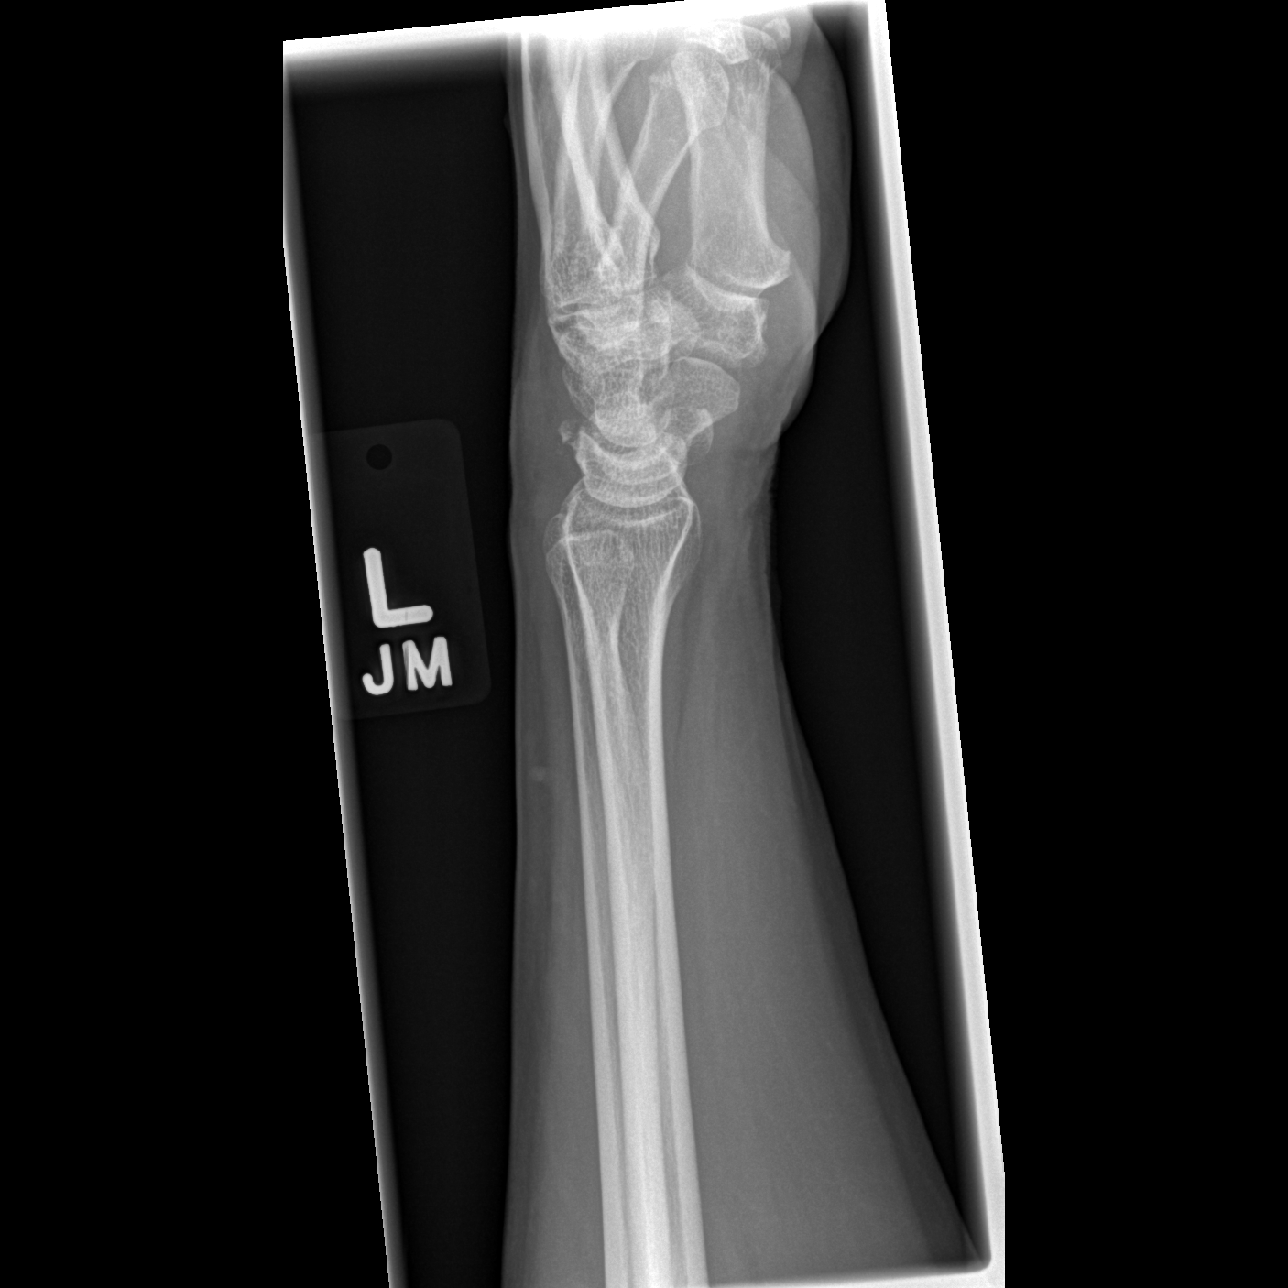

[x navicular]
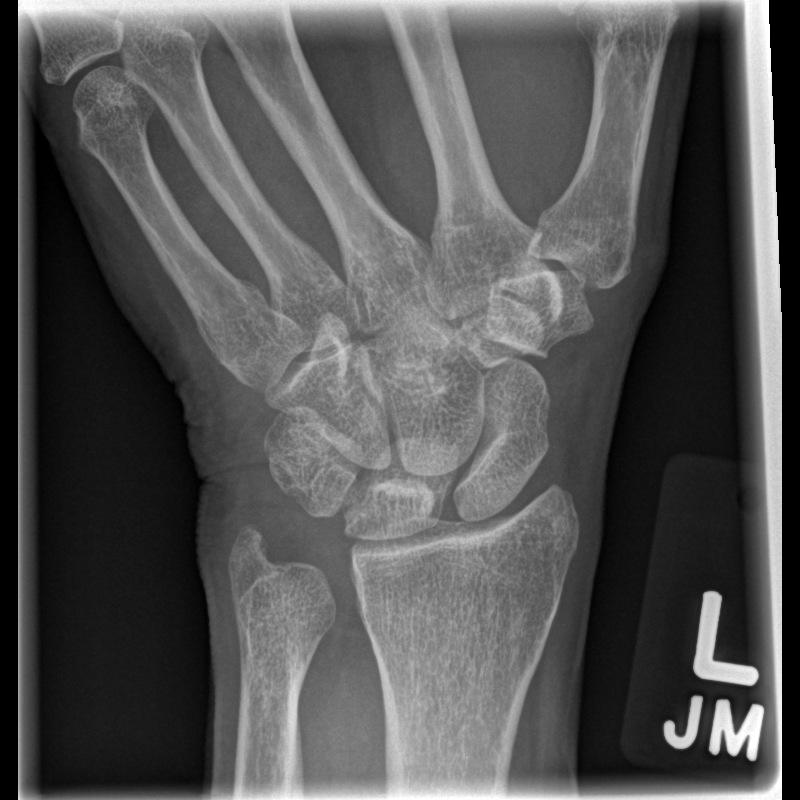

[x wrist pa left]
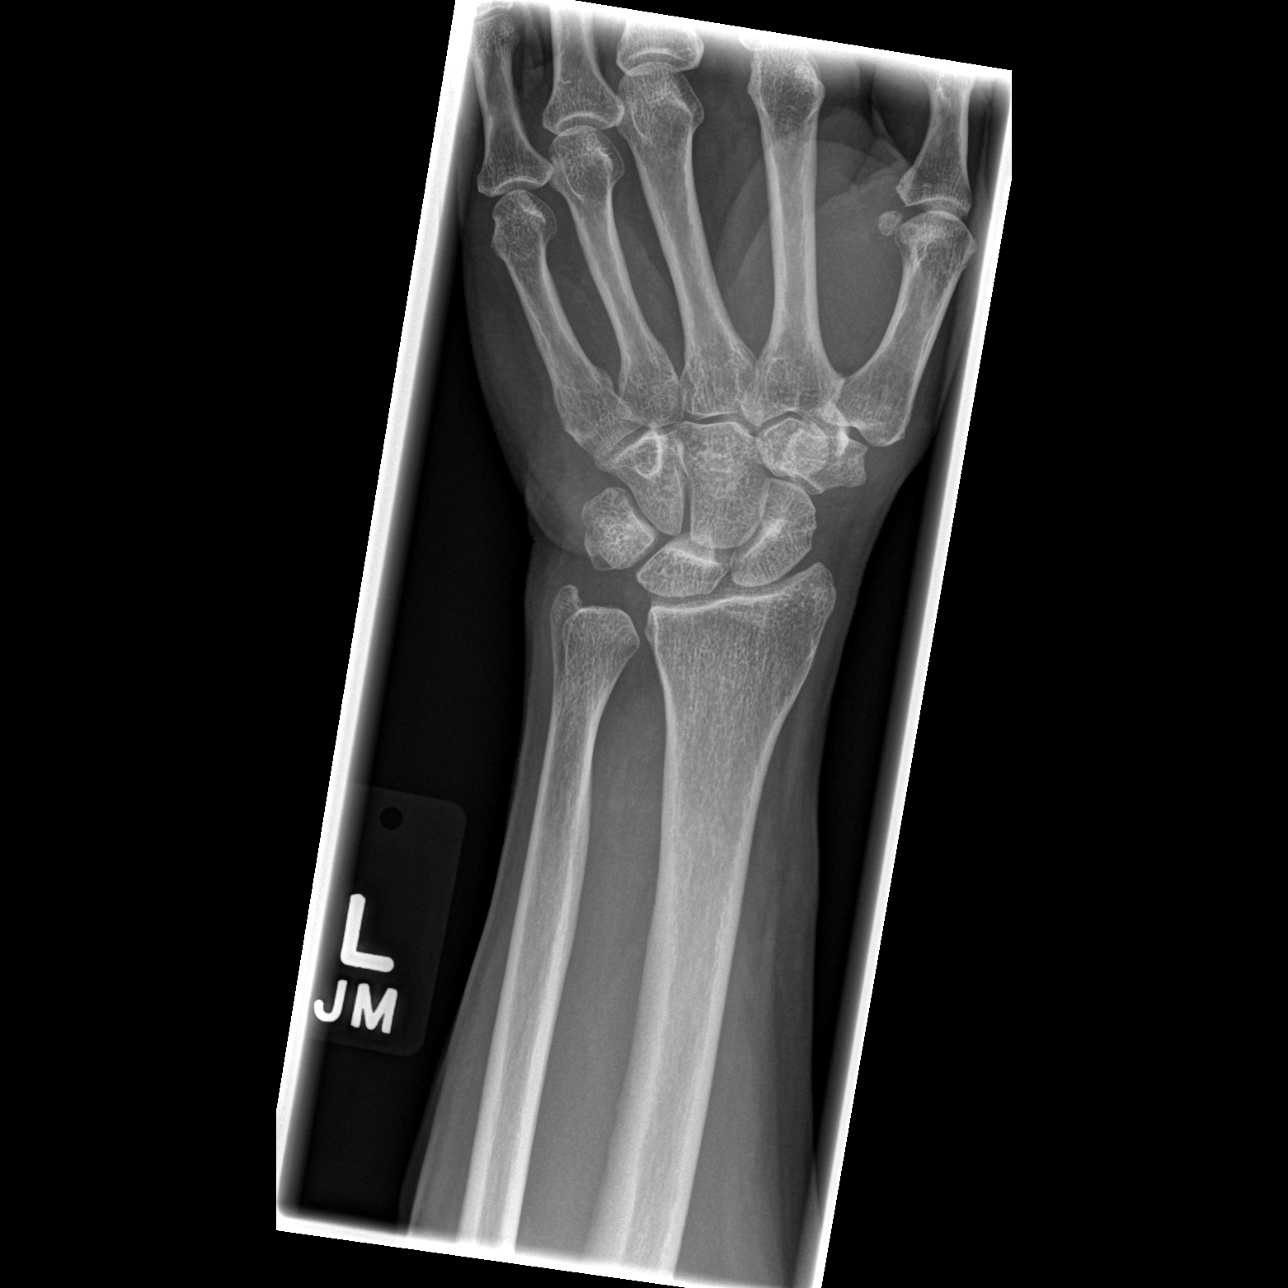

[x wrist obl left]
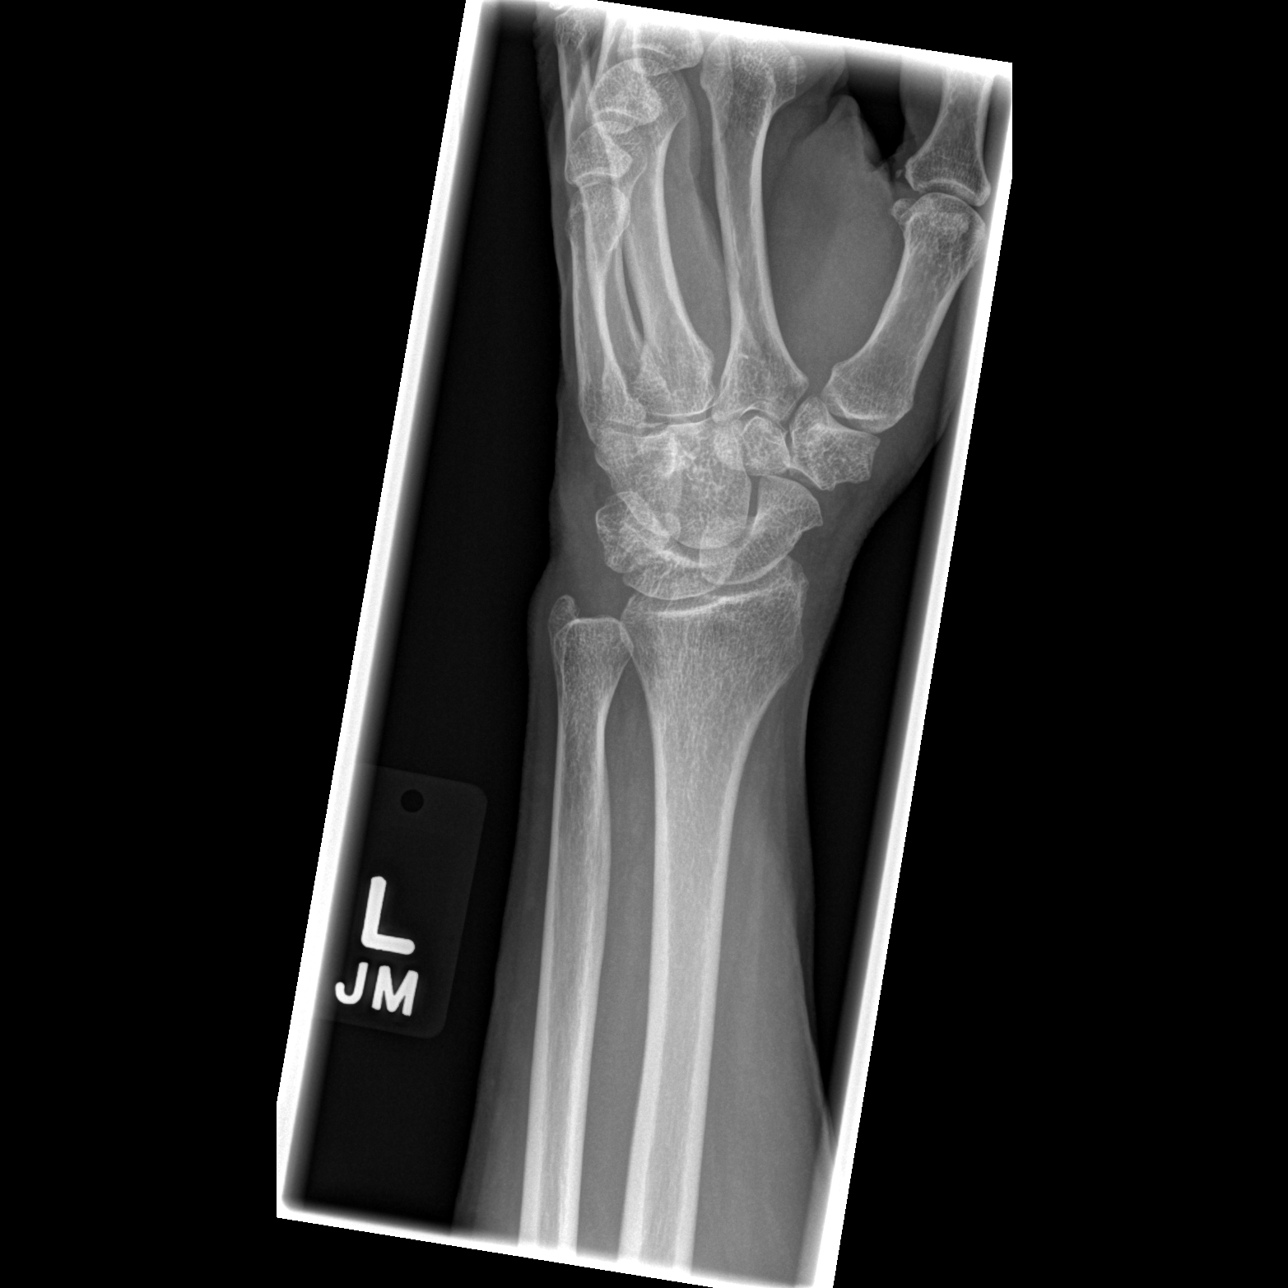

[4 of 4 positions shown; findings below may reference images not displayed]

FINDINGS: There is an acute, minimally displaced fracture of likely the dorsal
triquetrum with mild overlying soft tissue swelling. No other
fracture identified. There is diastasis of the distal radioulnar
joint which may relate to ligamentous injury. No associated fracture
in this location. Otherwise normal alignment.
IMPRESSION: Possible minimally displaced avulsion fracture of the dorsal
triquetrum and diastasis of the distal radioulnar joint. Mild dorsal
soft tissue swelling.

## 2022-07-20 ENCOUNTER — Other Ambulatory Visit: Payer: Self-pay | Admitting: Internal Medicine

## 2022-07-20 DIAGNOSIS — M5416 Radiculopathy, lumbar region: Secondary | ICD-10-CM

## 2023-03-16 ENCOUNTER — Emergency Department (HOSPITAL_BASED_OUTPATIENT_CLINIC_OR_DEPARTMENT_OTHER): Payer: Medicare PPO

## 2023-03-16 ENCOUNTER — Inpatient Hospital Stay (HOSPITAL_BASED_OUTPATIENT_CLINIC_OR_DEPARTMENT_OTHER)
Admission: EM | Admit: 2023-03-16 | Discharge: 2023-03-21 | DRG: 193 | Disposition: A | Discharge status: Home / Self Care | Payer: Medicare PPO | Attending: Internal Medicine | Admitting: Internal Medicine

## 2023-03-16 ENCOUNTER — Encounter (HOSPITAL_BASED_OUTPATIENT_CLINIC_OR_DEPARTMENT_OTHER): Payer: Self-pay | Admitting: Emergency Medicine

## 2023-03-16 DIAGNOSIS — Z79899 Other long term (current) drug therapy: Secondary | ICD-10-CM

## 2023-03-16 DIAGNOSIS — Z888 Allergy status to other drugs, medicaments and biological substances status: Secondary | ICD-10-CM

## 2023-03-16 DIAGNOSIS — Z7984 Long term (current) use of oral hypoglycemic drugs: Secondary | ICD-10-CM

## 2023-03-16 DIAGNOSIS — Z885 Allergy status to narcotic agent status: Secondary | ICD-10-CM

## 2023-03-16 DIAGNOSIS — F17210 Nicotine dependence, cigarettes, uncomplicated: Secondary | ICD-10-CM | POA: Diagnosis present

## 2023-03-16 DIAGNOSIS — M545 Low back pain, unspecified: Secondary | ICD-10-CM | POA: Diagnosis present

## 2023-03-16 DIAGNOSIS — I739 Peripheral vascular disease, unspecified: Secondary | ICD-10-CM | POA: Diagnosis present

## 2023-03-16 DIAGNOSIS — K219 Gastro-esophageal reflux disease without esophagitis: Secondary | ICD-10-CM | POA: Diagnosis present

## 2023-03-16 DIAGNOSIS — Z7952 Long term (current) use of systemic steroids: Secondary | ICD-10-CM

## 2023-03-16 DIAGNOSIS — J9601 Acute respiratory failure with hypoxia: Secondary | ICD-10-CM | POA: Diagnosis present

## 2023-03-16 DIAGNOSIS — G4733 Obstructive sleep apnea (adult) (pediatric): Secondary | ICD-10-CM | POA: Diagnosis present

## 2023-03-16 DIAGNOSIS — E782 Mixed hyperlipidemia: Secondary | ICD-10-CM | POA: Diagnosis present

## 2023-03-16 DIAGNOSIS — J441 Chronic obstructive pulmonary disease with (acute) exacerbation: Principal | ICD-10-CM | POA: Insufficient documentation

## 2023-03-16 DIAGNOSIS — E119 Type 2 diabetes mellitus without complications: Secondary | ICD-10-CM

## 2023-03-16 DIAGNOSIS — I10 Essential (primary) hypertension: Secondary | ICD-10-CM | POA: Diagnosis present

## 2023-03-16 DIAGNOSIS — F32A Depression, unspecified: Secondary | ICD-10-CM | POA: Diagnosis present

## 2023-03-16 DIAGNOSIS — Z1152 Encounter for screening for COVID-19: Secondary | ICD-10-CM

## 2023-03-16 DIAGNOSIS — Z7982 Long term (current) use of aspirin: Secondary | ICD-10-CM

## 2023-03-16 DIAGNOSIS — Z7902 Long term (current) use of antithrombotics/antiplatelets: Secondary | ICD-10-CM

## 2023-03-16 DIAGNOSIS — T380X5A Adverse effect of glucocorticoids and synthetic analogues, initial encounter: Secondary | ICD-10-CM | POA: Diagnosis present

## 2023-03-16 DIAGNOSIS — Z7951 Long term (current) use of inhaled steroids: Secondary | ICD-10-CM

## 2023-03-16 DIAGNOSIS — E1151 Type 2 diabetes mellitus with diabetic peripheral angiopathy without gangrene: Secondary | ICD-10-CM | POA: Diagnosis present

## 2023-03-16 DIAGNOSIS — R4189 Other symptoms and signs involving cognitive functions and awareness: Secondary | ICD-10-CM | POA: Diagnosis present

## 2023-03-16 DIAGNOSIS — J101 Influenza due to other identified influenza virus with other respiratory manifestations: Secondary | ICD-10-CM | POA: Diagnosis not present

## 2023-03-16 DIAGNOSIS — F419 Anxiety disorder, unspecified: Secondary | ICD-10-CM | POA: Diagnosis present

## 2023-03-16 DIAGNOSIS — R0602 Shortness of breath: Secondary | ICD-10-CM | POA: Diagnosis not present

## 2023-03-16 DIAGNOSIS — E1165 Type 2 diabetes mellitus with hyperglycemia: Secondary | ICD-10-CM | POA: Diagnosis present

## 2023-03-16 DIAGNOSIS — I6523 Occlusion and stenosis of bilateral carotid arteries: Secondary | ICD-10-CM | POA: Diagnosis present

## 2023-03-16 LAB — URINALYSIS, ROUTINE W REFLEX MICROSCOPIC
Bilirubin Urine: NEGATIVE
Glucose, UA: 100 mg/dL — AB
Hgb urine dipstick: NEGATIVE
Ketones, ur: NEGATIVE mg/dL
Nitrite: NEGATIVE
Protein, ur: NEGATIVE mg/dL
Specific Gravity, Urine: 1.02 (ref 1.005–1.030)
pH: 5.5 (ref 5.0–8.0)

## 2023-03-16 LAB — CBC WITH DIFFERENTIAL/PLATELET
Abs Immature Granulocytes: 0.06 10*3/uL (ref 0.00–0.07)
Basophils Absolute: 0 10*3/uL (ref 0.0–0.1)
Basophils Relative: 0 %
Eosinophils Absolute: 0 10*3/uL (ref 0.0–0.5)
Eosinophils Relative: 0 %
HCT: 39.2 % (ref 36.0–46.0)
Hemoglobin: 13.2 g/dL (ref 12.0–15.0)
Immature Granulocytes: 1 %
Lymphocytes Relative: 2 %
Lymphs Abs: 0.3 10*3/uL — ABNORMAL LOW (ref 0.7–4.0)
MCH: 29.5 pg (ref 26.0–34.0)
MCHC: 33.7 g/dL (ref 30.0–36.0)
MCV: 87.7 fL (ref 80.0–100.0)
Monocytes Absolute: 0.9 10*3/uL (ref 0.1–1.0)
Monocytes Relative: 7 %
Neutro Abs: 11.8 10*3/uL — ABNORMAL HIGH (ref 1.7–7.7)
Neutrophils Relative %: 90 %
Platelets: 268 10*3/uL (ref 150–400)
RBC: 4.47 MIL/uL (ref 3.87–5.11)
RDW: 13.8 % (ref 11.5–15.5)
WBC: 13.1 10*3/uL — ABNORMAL HIGH (ref 4.0–10.5)
nRBC: 0 % (ref 0.0–0.2)

## 2023-03-16 LAB — I-STAT VENOUS BLOOD GAS, ED
Acid-base deficit: 3 mmol/L — ABNORMAL HIGH (ref 0.0–2.0)
Bicarbonate: 22.4 mmol/L (ref 20.0–28.0)
Calcium, Ion: 1.24 mmol/L (ref 1.15–1.40)
HCT: 42 % (ref 36.0–46.0)
Hemoglobin: 14.3 g/dL (ref 12.0–15.0)
O2 Saturation: 95 %
Potassium: 3.5 mmol/L (ref 3.5–5.1)
Sodium: 132 mmol/L — ABNORMAL LOW (ref 135–145)
TCO2: 24 mmol/L (ref 22–32)
pCO2, Ven: 40.7 mm[Hg] — ABNORMAL LOW (ref 44–60)
pH, Ven: 7.348 (ref 7.25–7.43)
pO2, Ven: 81 mm[Hg] — ABNORMAL HIGH (ref 32–45)

## 2023-03-16 LAB — BASIC METABOLIC PANEL
Anion gap: 13 (ref 5–15)
BUN: 16 mg/dL (ref 8–23)
CO2: 20 mmol/L — ABNORMAL LOW (ref 22–32)
Calcium: 9.8 mg/dL (ref 8.9–10.3)
Chloride: 95 mmol/L — ABNORMAL LOW (ref 98–111)
Creatinine, Ser: 0.9 mg/dL (ref 0.44–1.00)
GFR, Estimated: >60 mL/min (ref >60)
Glucose, Bld: 224 mg/dL — ABNORMAL HIGH (ref 70–99)
Potassium: 3.8 mmol/L (ref 3.5–5.1)
Sodium: 128 mmol/L — ABNORMAL LOW (ref 135–145)

## 2023-03-16 LAB — RESP PANEL BY RT-PCR (RSV, FLU A&B, COVID)  RVPGX2
Influenza A by PCR: POSITIVE — AB
Influenza B by PCR: NEGATIVE
Resp Syncytial Virus by PCR: NEGATIVE
SARS Coronavirus 2 by RT PCR: NEGATIVE

## 2023-03-16 LAB — URINALYSIS, MICROSCOPIC (REFLEX)

## 2023-03-16 MED ORDER — ALBUTEROL SULFATE (2.5 MG/3ML) 0.083% IN NEBU
10.0000 mg | INHALATION_SOLUTION | RESPIRATORY_TRACT | Status: DC
  Administered 2023-03-16 (×2): 10 mg via RESPIRATORY_TRACT
  Filled 2023-03-16 (×2): qty 12

## 2023-03-16 MED ORDER — ALBUTEROL SULFATE (2.5 MG/3ML) 0.083% IN NEBU: 2.5000 mg | INHALATION_SOLUTION | Freq: Once | RESPIRATORY_TRACT | Status: AC

## 2023-03-16 MED ORDER — AMLODIPINE BESYLATE 5 MG PO TABS
10.0000 mg | ORAL_TABLET | Freq: Once | ORAL | Status: AC
  Administered 2023-03-16: 10 mg via ORAL
  Filled 2023-03-16: qty 2

## 2023-03-16 MED ORDER — IPRATROPIUM-ALBUTEROL 0.5-2.5 (3) MG/3ML IN SOLN: 3.0000 mL | Freq: Once | RESPIRATORY_TRACT | Status: AC

## 2023-03-16 MED ORDER — LORAZEPAM 2 MG/ML IJ SOLN
1.0000 mg | Freq: Once | INTRAMUSCULAR | Status: AC
  Administered 2023-03-16: 1 mg via INTRAVENOUS
  Filled 2023-03-16: qty 1

## 2023-03-16 MED ORDER — SODIUM CHLORIDE 0.9 % IV BOLUS
500.0000 mL | Freq: Once | INTRAVENOUS | Status: AC
  Administered 2023-03-16: 500 mL via INTRAVENOUS

## 2023-03-16 MED ORDER — IPRATROPIUM-ALBUTEROL 0.5-2.5 (3) MG/3ML IN SOLN
3.0000 mL | RESPIRATORY_TRACT | Status: DC | PRN
  Administered 2023-03-16 – 2023-03-17 (×5): 3 mL via RESPIRATORY_TRACT
  Filled 2023-03-16 (×5): qty 3

## 2023-03-16 MED ORDER — METHYLPREDNISOLONE SODIUM SUCC 125 MG IJ SOLR
125.0000 mg | Freq: Once | INTRAMUSCULAR | Status: AC
  Administered 2023-03-16: 125 mg via INTRAVENOUS
  Filled 2023-03-16: qty 2

## 2023-03-16 MED ORDER — MAGNESIUM SULFATE 2 GM/50ML IV SOLN
2.0000 g | Freq: Once | INTRAVENOUS | Status: AC
  Administered 2023-03-16: 2 g via INTRAVENOUS
  Filled 2023-03-16: qty 50

## 2023-03-16 MED ORDER — IPRATROPIUM-ALBUTEROL 0.5-2.5 (3) MG/3ML IN SOLN
RESPIRATORY_TRACT | Status: AC
  Administered 2023-03-16: 3 mL via RESPIRATORY_TRACT
  Filled 2023-03-16: qty 3

## 2023-03-16 MED ORDER — ALBUTEROL SULFATE (2.5 MG/3ML) 0.083% IN NEBU
INHALATION_SOLUTION | RESPIRATORY_TRACT | Status: AC
  Administered 2023-03-16: 2.5 mg via RESPIRATORY_TRACT
  Filled 2023-03-16: qty 3

## 2023-03-16 NOTE — ED Notes (Signed)
Patient helped from car, spot check SpO2 85%, placed on 4lpm [place] while registering.

## 2023-03-16 NOTE — ED Notes (Signed)
Coughing, BBS decreased with exp Wheezes.

## 2023-03-16 NOTE — ED Notes (Signed)
Attempted to wean patient to [place] HFNC. @15L . Patient RR increased within and patient was placed back on NIV at same settings. Did decrease FIO2 to 50%, patient oxygen saturation was 99%. Will continue to monitor and make necessary changes. Patient tolerating well.

## 2023-03-16 NOTE — ED Triage Notes (Signed)
Chest discomfort, shob, productive cough onset yesterday. Dx with URI yesterday at [facility]. Told to come to ER if sx worsened. Sx worsened today. No 02 at home. Patient 100% on 4L [place] with 25RR in triage. H/o copd and asthma.

## 2023-03-16 NOTE — ED Notes (Signed)
Late entry: SpO2 in parking lot before triage 85%, labored resp, unable to speak in complete sentences. Patient place on 4LPM [place] at registration, SpO2 increased to 100%.  Once in room decreased to 3lpm after Neb treatment.    03/16/23 1606  Respiratory Assessment  $ RT Protocol Assessment  Yes  Assessment Type Pre-treatment  Respiratory Pattern Regular;Unlabored;Accessory muscle use;Symmetrical;Dyspnea with exertion;Dyspnea at rest  Chest Assessment Chest expansion symmetrical  Cough Non-productive;Dry;Strong  R Upper  Breath Sounds Diminished;Expiratory wheezes  L Upper Breath Sounds Diminished  R Lower Breath Sounds Diminished  L Lower Breath Sounds Diminished  Oxygen Therapy/Pulse Ox  O2 Therapy Oxygen  O2 Flow Rate (L/min) 3 L/min  SpO2 97 %

## 2023-03-16 NOTE — ED Notes (Signed)
Pure wick placed on pt due to every time she excerpts herself at all, her O2 levels drop significantly.

## 2023-03-16 NOTE — ED Provider Notes (Addendum)
[place] [facility] Provider Note   CSN: [id number] Arrival date & time: 03/16/23  1508     History  Chief Complaint  Patient presents with   Shortness of Breath    Lisa Zamora is a 68 y.o. female.  Patient was seen by her primary care doctor in the [facility] area yesterday.  Chart reviewed this shows that were not able to see that visit.  Patient states she was treated with steroids got a steroid shot and was given a prescription for steroids patient has a history of asthma COPD.  Patient in worse shape here today.  Patient spotcheck after needing help to get out of the car her oxygen sats were 85%.  She was started on 4 L nasal cannula while registering.  That brought her oxygen levels up into the normal range.  Respiratory rate was high at 32 perhaps higher when she first got here pulse 113 temp 97.2 blood pressure 169/66.  Patient denies any fevers or any flulike symptoms.  But she felt as if there could be may be borderline fevers.  Past medical history hypertension diabetes and COPD.  Patient is an everyday smoker currently.       Home Medications Prior to Admission medications   Medication Sig Start Date End Date Taking? Authorizing Provider  acetaminophen (TYLENOL) 500 MG tablet Take 1,000 mg by mouth once as needed. For pain     [provider]  albuterol (PROVENTIL,VENTOLIN) 90 MCG/ACT inhaler Inhale 2 puffs into the lungs every 6 (six) hours as needed. For wheezing or shortness of breath     [provider]  AMLODIPINE BESYLATE PO Take 1 tablet by mouth daily.      [provider]  aspirin 325 MG EC tablet Take 325 mg by mouth daily.      [provider]  azithromycin (ZITHROMAX) 250 MG tablet Take 1 tablet (250 mg total) by mouth daily. Take first 2 tablets together, then 1 every day until finished. 11/14/17   Raeford Razor, MD  Budesonide-Formoterol Fumarate (SYMBICORT IN) Inhale into the lungs.     [provider]  clopidogrel (PLAVIX) 75 MG tablet Take 75 mg by mouth daily.      [provider]  GABAPENTIN PO Take 4 capsules by mouth at bedtime.      [provider]  HYDROCHLOROTHIAZIDE PO Take 1 tablet by mouth daily.      [provider]  HYDROcodone-acetaminophen (NORCO/VICODIN) 5-325 MG tablet Take 1 tablet by mouth every 4 (four) hours as needed. 05/15/15   Ward, Chase Picket, PA-C  ibuprofen (ADVIL,MOTRIN) 600 MG tablet Take 1 tablet (600 mg total) by mouth every 6 (six) hours as needed. 05/15/15   Ward, Chase Picket, PA-C  Menthol, Topical Analgesic, (BIOFREEZE EX) Apply 1 application topically once as needed. For pain     [provider]  METFORMIN HCL PO Take 1 tablet by mouth daily.      [provider]  oxyCODONE-acetaminophen (PERCOCET/ROXICET) 5-325 MG tablet Take 1 tablet by mouth every 6 (six) hours as needed for severe pain. 05/15/15   Ward, Chase Picket, PA-C  pioglitazone (ACTOS) 15 MG tablet Take 15 mg by mouth daily.    [provider]  traMADol (ULTRAM) 50 MG tablet Take 1 tablet (50 mg total) by mouth every 12 (twelve) hours as needed for severe pain. 10/31/15   Tomasita Crumble, MD      Allergies    Ezetimibe, Hydrocodone,  and Statins    Review of Systems   Review of Systems  Constitutional:  Positive for fever. Negative for chills.  HENT:  Negative for ear pain and sore throat.   Eyes:  Negative for pain and visual disturbance.  Respiratory:  Positive for shortness of breath and wheezing. Negative for cough.   Cardiovascular:  Negative for chest pain and palpitations.  Gastrointestinal:  Negative for abdominal pain and vomiting.  Genitourinary:  Negative for dysuria and hematuria.  Musculoskeletal:  Negative for arthralgias and back pain.  Skin:  Negative for color change and rash.  Neurological:  Negative for seizures and syncope.  All other systems reviewed and are negative.   Physical  Exam Updated Vital Signs BP (!) 162/72   Pulse (!) 115   Temp (!) 97.2 F (36.2 C)   Resp (!) 31   Ht 1.6 m (5\' 3" )   Wt 61.7 kg   SpO2 95%   BMI 24.09 kg/m  Physical Exam Vitals and nursing note reviewed.  Constitutional:      General: She is in acute distress.     Appearance: Normal appearance. She is well-developed.  HENT:     Head: Normocephalic and atraumatic.  Eyes:     Conjunctiva/sclera: Conjunctivae normal.  Cardiovascular:     Rate and Rhythm: Regular rhythm. Tachycardia present.     Heart sounds: No murmur heard. Pulmonary:     Effort: Respiratory distress present.     Breath sounds: Wheezing present.  Abdominal:     Palpations: Abdomen is soft.     Tenderness: There is no abdominal tenderness.  Musculoskeletal:        General: No swelling.     Cervical back: Normal range of motion and neck supple.  Skin:    General: Skin is warm and dry.     Capillary Refill: Capillary refill takes less than 2 seconds.  Neurological:     General: No focal deficit present.     Mental Status: She is alert and oriented to person, place, and time.  Psychiatric:        Mood and Affect: Mood normal.     ED Results / Procedures / Treatments   Labs (all labs ordered are listed, but only abnormal results are displayed) Labs Reviewed  RESP PANEL BY RT-PCR (RSV, FLU A&B, COVID)  RVPGX2 - Abnormal; Notable for the following components:      Result Value   Influenza A by PCR POSITIVE (*)    All other components within normal limits  CBC WITH DIFFERENTIAL/PLATELET - Abnormal; Notable for the following components:   WBC 13.1 (*)    Neutro Abs 11.8 (*)    Lymphs Abs 0.3 (*)    All other components within normal limits  BASIC METABOLIC PANEL - Abnormal; Notable for the following components:   Sodium 128 (*)    Chloride 95 (*)    CO2 20 (*)    Glucose, Bld 224 (*)    All other components within normal limits  I-STAT VENOUS BLOOD GAS, ED - Abnormal; Notable for the following  components:   pCO2, Ven 40.7 (*)    pO2, Ven 81 (*)    Acid-base deficit 3.0 (*)    Sodium 132 (*)    All other components within normal limits  URINALYSIS, ROUTINE W REFLEX MICROSCOPIC    EKG None  Radiology DG Chest Port 1 View Result Date: 03/16/2023 CLINICAL DATA:  Shortness of breath. EXAM: PORTABLE CHEST 1 VIEW COMPARISON:  01/22/2022  and CT chest 12/18/2009. FINDINGS: Trachea is midline. Heart is at the upper limits of normal in size, stable. Thoracic aorta is calcified. Minimal streaky scarring in the left lung base. Calcified granulomas. Lungs are otherwise clear. No pleural fluid. IMPRESSION: No acute findings. Electronically Signed   By: Leanna Battles M.D.   On: 03/16/2023 16:13    Procedures Procedures    Medications Ordered in ED Medications  albuterol (PROVENTIL) (2.5 MG/3ML) 0.083% nebulizer solution 10 mg (10 mg Nebulization New Bag/Given 03/16/23 1734)  ipratropium-albuterol (DUONEB) 0.5-2.5 (3) MG/3ML nebulizer solution 3 mL (3 mLs Nebulization Given 03/16/23 1536)  albuterol (PROVENTIL) (2.5 MG/3ML) 0.083% nebulizer solution 2.5 mg (2.5 mg Nebulization Given 03/16/23 1536)  sodium chloride 0.9 % bolus 500 mL (500 mLs Intravenous New Bag/Given 03/16/23 1644)    ED Course/ Medical Decision Making/ A&P                                 Medical Decision Making Amount and/or Complexity of Data Reviewed Labs: ordered. Radiology: ordered.  Risk Prescription drug management. Decision regarding hospitalization.   Patient does not use oxygen at home.  Requiring oxygen here.  Did receive steroids yesterday and took a pill steroid today.  Patient still in respiratory distress.  Will get chest x-ray patient receiving nebulizer treatment currently.  Will check for respiratory panel.  Will get basic labs.  Patient most likely will require admission since she presented in some respiratory distress and hypoxia.  Does not wear oxygen at home.  CRITICAL CARE Performed by: Vanetta Mulders Total critical care time: 45 minutes Critical care time was exclusive of separately billable procedures and treating other patients. Critical care was necessary to treat or prevent imminent or life-threatening deterioration. Critical care was time spent personally by me on the following activities: development of treatment plan with patient and/or surrogate as well as nursing, discussions with consultants, evaluation of patient's response to treatment, examination of patient, obtaining history from patient or surrogate, ordering and performing treatments and interventions, ordering and review of laboratory studies, ordering and review of radiographic studies, pulse oximetry and re-evaluation of patient's condition.   Patient's labs pH 7.3 pCO2 40 pO2 81.  White count 13.1 hemoglobin 13.2 platelets are 268 basic metabolic panel sodium 128, however potassium is good at 3.8.  CO2 20 GFR greater than 60.  Respiratory panel surprisingly positive for influenza A despite not having classic flulike symptoms.  And portable x-ray x-ray without any acute findings.  Urinalysis pending.  Patient is wheezing came back he started the struggles over starting a continuous nebulizer.  Will review when she last had steroids and probably give a dose of steroids and she is going to require admission.  Patient ran into some respiratory difficult.  So we are starting continuous neb.  She is actually did not tolerate that very well got worse.  Will switch her over to BiPAP.  Seems to be improving.  Ordered a dose of Solu-Medrol.  Patient still able to talk patient alert.  Will also give 1 mg of Ativan to help chill her.  On the BiPAP she is currently satting about 97%.  We tried patient off BiPAP but she did not do too well.  Is doing fairly well with the BiPAP on.  Will add on magnesium as well.  Patient still waiting for bed placement.   Final Clinical Impression(s) / ED Diagnoses Final diagnoses:  COPD  exacerbation (HCC)  Acute respiratory failure with hypoxia (HCC)  Influenza A    Rx / DC Orders ED Discharge Orders     None         Vanetta Mulders, MD 03/16/23 1555    Vanetta Mulders, MD 03/16/23 1737    Vanetta Mulders, MD 03/16/23 1801    Vanetta Mulders, MD 03/16/23 2051

## 2023-03-16 NOTE — ED Notes (Signed)
Wasted first 10mg  albuterol, split during patient movement.

## 2023-03-17 ENCOUNTER — Other Ambulatory Visit: Payer: Self-pay

## 2023-03-17 ENCOUNTER — Encounter (HOSPITAL_BASED_OUTPATIENT_CLINIC_OR_DEPARTMENT_OTHER): Payer: Self-pay | Admitting: Internal Medicine

## 2023-03-17 DIAGNOSIS — I739 Peripheral vascular disease, unspecified: Secondary | ICD-10-CM

## 2023-03-17 DIAGNOSIS — Z7984 Long term (current) use of oral hypoglycemic drugs: Secondary | ICD-10-CM | POA: Diagnosis not present

## 2023-03-17 DIAGNOSIS — Z888 Allergy status to other drugs, medicaments and biological substances status: Secondary | ICD-10-CM | POA: Diagnosis not present

## 2023-03-17 DIAGNOSIS — F32A Depression, unspecified: Secondary | ICD-10-CM

## 2023-03-17 DIAGNOSIS — J9601 Acute respiratory failure with hypoxia: Secondary | ICD-10-CM | POA: Diagnosis present

## 2023-03-17 DIAGNOSIS — J441 Chronic obstructive pulmonary disease with (acute) exacerbation: Secondary | ICD-10-CM | POA: Insufficient documentation

## 2023-03-17 DIAGNOSIS — G4733 Obstructive sleep apnea (adult) (pediatric): Secondary | ICD-10-CM | POA: Diagnosis present

## 2023-03-17 DIAGNOSIS — E1151 Type 2 diabetes mellitus with diabetic peripheral angiopathy without gangrene: Secondary | ICD-10-CM | POA: Diagnosis present

## 2023-03-17 DIAGNOSIS — M545 Low back pain, unspecified: Secondary | ICD-10-CM | POA: Diagnosis present

## 2023-03-17 DIAGNOSIS — T380X5A Adverse effect of glucocorticoids and synthetic analogues, initial encounter: Secondary | ICD-10-CM | POA: Diagnosis present

## 2023-03-17 DIAGNOSIS — E782 Mixed hyperlipidemia: Secondary | ICD-10-CM

## 2023-03-17 DIAGNOSIS — R4189 Other symptoms and signs involving cognitive functions and awareness: Secondary | ICD-10-CM

## 2023-03-17 DIAGNOSIS — F419 Anxiety disorder, unspecified: Secondary | ICD-10-CM | POA: Diagnosis present

## 2023-03-17 DIAGNOSIS — R0602 Shortness of breath: Secondary | ICD-10-CM | POA: Diagnosis present

## 2023-03-17 DIAGNOSIS — J101 Influenza due to other identified influenza virus with other respiratory manifestations: Secondary | ICD-10-CM | POA: Insufficient documentation

## 2023-03-17 DIAGNOSIS — Z7982 Long term (current) use of aspirin: Secondary | ICD-10-CM | POA: Diagnosis not present

## 2023-03-17 DIAGNOSIS — Z1152 Encounter for screening for COVID-19: Secondary | ICD-10-CM | POA: Diagnosis not present

## 2023-03-17 DIAGNOSIS — I6523 Occlusion and stenosis of bilateral carotid arteries: Secondary | ICD-10-CM | POA: Diagnosis present

## 2023-03-17 DIAGNOSIS — Z7951 Long term (current) use of inhaled steroids: Secondary | ICD-10-CM | POA: Diagnosis not present

## 2023-03-17 DIAGNOSIS — E119 Type 2 diabetes mellitus without complications: Secondary | ICD-10-CM | POA: Diagnosis not present

## 2023-03-17 DIAGNOSIS — K219 Gastro-esophageal reflux disease without esophagitis: Secondary | ICD-10-CM | POA: Diagnosis present

## 2023-03-17 DIAGNOSIS — I10 Essential (primary) hypertension: Secondary | ICD-10-CM | POA: Diagnosis present

## 2023-03-17 DIAGNOSIS — F17210 Nicotine dependence, cigarettes, uncomplicated: Secondary | ICD-10-CM | POA: Diagnosis present

## 2023-03-17 DIAGNOSIS — E1165 Type 2 diabetes mellitus with hyperglycemia: Secondary | ICD-10-CM | POA: Diagnosis present

## 2023-03-17 DIAGNOSIS — Z885 Allergy status to narcotic agent status: Secondary | ICD-10-CM | POA: Diagnosis not present

## 2023-03-17 DIAGNOSIS — Z7902 Long term (current) use of antithrombotics/antiplatelets: Secondary | ICD-10-CM | POA: Diagnosis not present

## 2023-03-17 DIAGNOSIS — Z7952 Long term (current) use of systemic steroids: Secondary | ICD-10-CM | POA: Diagnosis not present

## 2023-03-17 LAB — CBG MONITORING, ED: Glucose-Capillary: 181 mg/dL — ABNORMAL HIGH (ref 70–99)

## 2023-03-17 LAB — COMPREHENSIVE METABOLIC PANEL
ALT: 31 U/L (ref 0–44)
AST: 41 U/L (ref 15–41)
Albumin: 3.3 g/dL — ABNORMAL LOW (ref 3.5–5.0)
Alkaline Phosphatase: 60 U/L (ref 38–126)
Anion gap: 10 (ref 5–15)
BUN: 14 mg/dL (ref 8–23)
CO2: 29 mmol/L (ref 22–32)
Calcium: 9.4 mg/dL (ref 8.9–10.3)
Chloride: 96 mmol/L — ABNORMAL LOW (ref 98–111)
Creatinine, Ser: 1.01 mg/dL — ABNORMAL HIGH (ref 0.44–1.00)
GFR, Estimated: >60 mL/min (ref >60)
Glucose, Bld: 147 mg/dL — ABNORMAL HIGH (ref 70–99)
Potassium: 4.3 mmol/L (ref 3.5–5.1)
Sodium: 135 mmol/L (ref 135–145)
Total Bilirubin: 0.5 mg/dL (ref 0.0–1.2)
Total Protein: 6.6 g/dL (ref 6.5–8.1)

## 2023-03-17 LAB — BLOOD GAS, ARTERIAL
Acid-Base Excess: 7.5 mmol/L — ABNORMAL HIGH (ref 0.0–2.0)
Bicarbonate: 34.1 mmol/L — ABNORMAL HIGH (ref 20.0–28.0)
O2 Saturation: 99.3 %
Patient temperature: 37
pCO2 arterial: 55 mm[Hg] — ABNORMAL HIGH (ref 32–48)
pH, Arterial: 7.4 (ref 7.35–7.45)
pO2, Arterial: 105 mm[Hg] (ref 83–108)

## 2023-03-17 LAB — CBC
HCT: 39.1 % (ref 36.0–46.0)
Hemoglobin: 12.8 g/dL (ref 12.0–15.0)
MCH: 29.6 pg (ref 26.0–34.0)
MCHC: 32.7 g/dL (ref 30.0–36.0)
MCV: 90.3 fL (ref 80.0–100.0)
Platelets: 263 10*3/uL (ref 150–400)
RBC: 4.33 MIL/uL (ref 3.87–5.11)
RDW: 13.8 % (ref 11.5–15.5)
WBC: 13 10*3/uL — ABNORMAL HIGH (ref 4.0–10.5)
nRBC: 0 % (ref 0.0–0.2)

## 2023-03-17 LAB — HEMOGLOBIN A1C
Hgb A1c MFr Bld: 7.2 % — ABNORMAL HIGH (ref 4.8–5.6)
Mean Plasma Glucose: 159.94 mg/dL

## 2023-03-17 LAB — GLUCOSE, CAPILLARY
Glucose-Capillary: 171 mg/dL — ABNORMAL HIGH (ref 70–99)
Glucose-Capillary: 154 mg/dL — ABNORMAL HIGH (ref 70–99)

## 2023-03-17 LAB — HIV ANTIBODY (ROUTINE TESTING W REFLEX): HIV Screen 4th Generation wRfx: NONREACTIVE

## 2023-03-17 LAB — PROCALCITONIN: Procalcitonin: 0.22 ng/mL

## 2023-03-17 MED ORDER — SODIUM CHLORIDE 0.9 % IV SOLN: INTRAVENOUS | Status: DC

## 2023-03-17 MED ORDER — OSELTAMIVIR PHOSPHATE 75 MG PO CAPS
75.0000 mg | ORAL_CAPSULE | Freq: Once | ORAL | Status: DC
  Filled 2023-03-17: qty 1

## 2023-03-17 MED ORDER — IPRATROPIUM BROMIDE 0.02 % IN SOLN
0.5000 mg | Freq: Four times a day (QID) | RESPIRATORY_TRACT | Status: DC
  Administered 2023-03-17 – 2023-03-18 (×3): 0.5 mg via RESPIRATORY_TRACT
  Filled 2023-03-17 (×3): qty 2.5

## 2023-03-17 MED ORDER — ACETAMINOPHEN 10 MG/ML IV SOLN
1000.0000 mg | Freq: Three times a day (TID) | INTRAVENOUS | Status: AC | PRN
  Administered 2023-03-17: 1000 mg via INTRAVENOUS
  Filled 2023-03-17: qty 100

## 2023-03-17 MED ORDER — BUDESONIDE-FORMOTEROL FUMARATE 80-4.5 MCG/ACT IN AERO
2.0000 | INHALATION_SPRAY | Freq: Two times a day (BID) | RESPIRATORY_TRACT | Status: DC
  Filled 2023-03-17: qty 6.9

## 2023-03-17 MED ORDER — POLYETHYLENE GLYCOL 3350 17 G PO PACK
17.0000 g | PACK | Freq: Every day | ORAL | Status: DC | PRN
Start: 2023-03-17 — End: 2023-03-21

## 2023-03-17 MED ORDER — MOMETASONE FURO-FORMOTEROL FUM 100-5 MCG/ACT IN AERO: 2.0000 | INHALATION_SPRAY | Freq: Two times a day (BID) | RESPIRATORY_TRACT | Status: DC

## 2023-03-17 MED ORDER — OSELTAMIVIR PHOSPHATE 30 MG PO CAPS
30.0000 mg | ORAL_CAPSULE | Freq: Two times a day (BID) | ORAL | Status: DC
  Filled 2023-03-17 (×2): qty 1

## 2023-03-17 MED ORDER — ACETAMINOPHEN 650 MG RE SUPP: 650.0000 mg | Freq: Four times a day (QID) | RECTAL | Status: DC | PRN

## 2023-03-17 MED ORDER — ENOXAPARIN SODIUM 40 MG/0.4ML IJ SOSY
40.0000 mg | PREFILLED_SYRINGE | INTRAMUSCULAR | Status: DC
  Administered 2023-03-17 – 2023-03-20 (×4): 40 mg via SUBCUTANEOUS
  Filled 2023-03-17 (×4): qty 0.4

## 2023-03-17 MED ORDER — PREDNISONE 5 MG PO TABS: 50.0000 mg | ORAL_TABLET | Freq: Every day | ORAL | Status: DC

## 2023-03-17 MED ORDER — METHYLPREDNISOLONE SODIUM SUCC 40 MG IJ SOLR
40.0000 mg | Freq: Once | INTRAMUSCULAR | Status: AC
  Administered 2023-03-17: 40 mg via INTRAVENOUS
  Filled 2023-03-17: qty 1

## 2023-03-17 MED ORDER — SODIUM CHLORIDE 0.9% FLUSH
3.0000 mL | Freq: Two times a day (BID) | INTRAVENOUS | Status: DC
  Administered 2023-03-17 – 2023-03-21 (×9): 3 mL via INTRAVENOUS

## 2023-03-17 MED ORDER — MONTELUKAST SODIUM 10 MG PO TABS
10.0000 mg | ORAL_TABLET | Freq: Every day | ORAL | Status: DC
  Administered 2023-03-18 – 2023-03-20 (×3): 10 mg via ORAL
  Filled 2023-03-17 (×4): qty 1

## 2023-03-17 MED ORDER — ACETAMINOPHEN 325 MG PO TABS
650.0000 mg | ORAL_TABLET | Freq: Four times a day (QID) | ORAL | Status: DC | PRN
  Filled 2023-03-17: qty 2

## 2023-03-17 MED ORDER — INSULIN ASPART 100 UNIT/ML IJ SOLN
0.0000 [IU] | INTRAMUSCULAR | Status: DC
  Administered 2023-03-17 – 2023-03-18 (×4): 1 [IU] via SUBCUTANEOUS
  Administered 2023-03-18: 2 [IU] via SUBCUTANEOUS
  Administered 2023-03-19: 1 [IU] via SUBCUTANEOUS

## 2023-03-17 MED ORDER — ALBUTEROL SULFATE (2.5 MG/3ML) 0.083% IN NEBU
2.5000 mg | INHALATION_SOLUTION | RESPIRATORY_TRACT | Status: DC | PRN
  Administered 2023-03-20: 2.5 mg via RESPIRATORY_TRACT
  Filled 2023-03-17: qty 3

## 2023-03-17 MED ORDER — LORAZEPAM 2 MG/ML IJ SOLN
1.0000 mg | Freq: Once | INTRAMUSCULAR | Status: AC
Start: 2023-03-17 — End: 2023-03-17
  Administered 2023-03-17: 1 mg via INTRAVENOUS
  Filled 2023-03-17: qty 1

## 2023-03-17 NOTE — Progress Notes (Signed)
Pt not tolerating [place] trial off BiPAP at this time. Pt taken off RT and placed on 4L [place], after around 10 mins pt placed back on BiPAP due to increased WOB and pt complaining of SOB. RN aware,MD notified RT will monitor as needed.

## 2023-03-17 NOTE — ED Notes (Signed)
Changed to large fisher pykel mask, medium mask with large leak.

## 2023-03-17 NOTE — H&P (Signed)
History and Physical   Lisa Zamora ZOX:[id number] DOB: 06-26-1955 DOA: 03/16/2023  PCP: Melvenia Beam, MD   Patient coming from: Home  Chief Complaint: Shortness of breath  HPI: Lisa Zamora is a 68 y.o. female with medical history significant of COPD, hypertension, diabetes, PAD, carotid artery disease, OSA, depression, memory impairment, low back pain presenting with worsening shortness of breath.  Patient has ongoing shortness of breath for the past 3-5 days which have been gradually worsening.  Patient evaluated outpatient 2 days ago and appears patient was diagnosed with COPD exacerbation, full records not available.  Patient received steroid injection and started on a steroid taper.  Continued worsening shortness of breath and called EMS and was transported to the ED for further evaluation yesterday.   Denies fevers, chills, chest pain, abdominal pain, constipation, diarrhea, nausea, vomiting.  ED Course: Vital signs in the ED notable for respiratory rate in the 30s, requiring BiPAP for work of breathing and hypoxia.  Blood pressure in the 110s to 160s systolic, heart rate in the 100s to 120s.  Lab workup included BMP with sodium 128, chloride 95, bicarb 20, glucose 224.  CBC with leukocytosis to 13.1.  Respiratory panel positive for influenza A.  Urinalysis with glucose, leukocytes, bacteria.  VBG with normal pH and pCO2 40.7.  Chest x-ray showed no acute normality.  Patient received Solu-Medrol, magnesium, DuoNebs, albuterol, Ativan, 500 cc IV fluid in the ED.  Patient accepted for admission yesterday and transferred when bed was available today.  Review of Systems: As per HPI otherwise all other systems reviewed and are negative.  Past Medical History:  Diagnosis Date   COPD (chronic obstructive pulmonary disease) (HCC)    Diabetes mellitus    Hypertension     Past Surgical History:  Procedure Laterality Date   ENDOSCOPIC STENT PLACEMENT W/ MLB      Social  History  reports that she has been smoking cigarettes. She has never used smokeless tobacco. She reports current alcohol use. She reports that she does not use drugs.  Allergies  Allergen Reactions   Ezetimibe     Other Reaction(s): Other (See Comments)  Gas.   Hydrocodone Other (See Comments)    Psychosis/hallucinations/sleep walking.  Other Reaction(s): Altered mental status, Other (See Comments)  Psychosis/hallucinations/sleep walking.   Psychosis/hallucinations/sleep walking.   Statins     Other Reaction(s): Myalgias, Myalgias (intolerance)   History reviewed. No pertinent family history.  Prior to Admission medications   Medication Sig Start Date End Date Taking? Authorizing Provider  AMLODIPINE BESYLATE PO Take 1 tablet by mouth daily.     Yes [provider]  aspirin 325 MG EC tablet Take 325 mg by mouth daily.     Yes [provider]  Budesonide-Formoterol Fumarate (SYMBICORT IN) Inhale into the lungs.   Yes [provider]  clopidogrel (PLAVIX) 75 MG tablet Take 75 mg by mouth daily.     Yes [provider]  ezetimibe (ZETIA) 10 MG tablet Take 10 mg by mouth daily.   Yes [provider]  HYDROCHLOROTHIAZIDE PO Take 1 tablet by mouth daily.     Yes [provider]  losartan (COZAAR) 100 MG tablet Take 100 mg by mouth daily.   Yes [provider]  METFORMIN HCL PO Take 500 mg by mouth daily.   Yes [provider]  metoprolol succinate (TOPROL-XL) 50 MG 24 hr tablet Take 50 mg by mouth daily. Take with or immediately following a meal.   Yes [provider]  montelukast (SINGULAIR) 10 MG tablet Take 10 mg by mouth at bedtime.   Yes [provider]  omeprazole (PRILOSEC) 20 MG capsule Take 20 mg by mouth every morning. 01/25/23  Yes [provider]  predniSONE (DELTASONE) 20 MG tablet Take 20 mg by mouth daily with breakfast.   Yes [provider]    Physical  Exam: Vitals:   03/17/23 1115 03/17/23 1128 03/17/23 1215 03/17/23 1236  BP:  (!) 167/77  (!) 146/83  Pulse: (!) 104 100  (!) 102  Resp: (!) 34 (!) 28  (!) 29  Temp:    97.7 F (36.5 C)  TempSrc:    Axillary  SpO2: 99% 98% 97% 99%  Weight:      Height:        Physical Exam Constitutional:      General: She is not in acute distress.    Appearance: Normal appearance.  HENT:     Head: Normocephalic and atraumatic.     Mouth/Throat:     Mouth: Mucous membranes are moist.     Pharynx: Oropharynx is clear.  Eyes:     Extraocular Movements: Extraocular movements intact.     Pupils: Pupils are equal, round, and reactive to light.  Cardiovascular:     Rate and Rhythm: Regular rhythm. Tachycardia present.     Pulses: Normal pulses.     Heart sounds: Normal heart sounds.  Pulmonary:     Effort: Pulmonary effort is normal. No respiratory distress.     Breath sounds: Wheezing present.     Comments: On BiPAP Abdominal:     General: Bowel sounds are normal. There is no distension.     Palpations: Abdomen is soft.     Tenderness: There is no abdominal tenderness.  Musculoskeletal:        General: No swelling or deformity.  Skin:    General: Skin is warm and dry.  Neurological:     General: No focal deficit present.     Mental Status: Mental status is at baseline.    Labs on Admission: I have personally reviewed following labs and imaging studies  CBC: Recent Labs  Lab 03/16/23 1549 03/16/23 1600  WBC 13.1*  --   NEUTROABS 11.8*  --   HGB 13.2 14.3  HCT 39.2 42.0  MCV 87.7  --   PLT 268  --     Basic Metabolic Panel: Recent Labs  Lab 03/16/23 1549 03/16/23 1600  NA 128* 132*  K 3.8 3.5  CL 95*  --   CO2 20*  --   GLUCOSE 224*  --   BUN 16  --   CREATININE 0.90  --   CALCIUM 9.8  --     GFR: Estimated Creatinine Clearance: 50.2 mL/min (by C-G formula based on SCr of 0.9 mg/dL).  Liver Function Tests: No results for input(s): "AST", "ALT", "ALKPHOS",  "BILITOT", "PROT", "ALBUMIN" in the last 168 hours.  Urine analysis:    Component Value Date/Time   COLORURINE YELLOW 03/16/2023 1806   APPEARANCEUR CLEAR 03/16/2023 1806   LABSPEC 1.020 03/16/2023 1806   PHURINE 5.5 03/16/2023 1806   GLUCOSEU 100 (A) 03/16/2023 1806   HGBUR NEGATIVE 03/16/2023 1806   BILIRUBINUR NEGATIVE 03/16/2023 1806   KETONESUR NEGATIVE 03/16/2023 1806   PROTEINUR NEGATIVE 03/16/2023 1806   UROBILINOGEN 0.2 12/18/2009 1207   NITRITE NEGATIVE 03/16/2023 1806   LEUKOCYTESUR SMALL (A) 03/16/2023 1806    Radiological Exams on Admission: DG Chest Port 1 [address]  Result Date: 03/16/2023 CLINICAL DATA:  Shortness of breath. EXAM: PORTABLE CHEST 1 VIEW COMPARISON:  01/22/2022 and CT chest 12/18/2009. FINDINGS: Trachea is midline. Heart is at the upper limits of normal in size, stable. Thoracic aorta is calcified. Minimal streaky scarring in the left lung base. Calcified granulomas. Lungs are otherwise clear. No pleural fluid. IMPRESSION: No acute findings. Electronically Signed   By: Leanna Battles M.D.   On: 03/16/2023 16:13   EKG: Not performed in emergency department  Assessment/Plan Principal Problem:   Acute respiratory failure with hypoxia (HCC) Active Problems:   Bilateral carotid artery stenosis   Depressive disorder   Impaired cognition   Mixed hyperlipidemia   Obstructive sleep apnea (adult) (pediatric)   PAD (peripheral artery disease) (HCC)   Essential hypertension   Type 2 diabetes mellitus without complication, without long-term current use of insulin (HCC)   COPD with acute exacerbation (HCC)   Influenza A   Acute respiratory failure with hypoxia COPD exacerbation Influenza A > Presenting with worsening shortness of breath after being started on steroid taper outpatient due to COPD exacerbation. > Continued shortness of breath and noted to be hypoxic with increased work of breathing has required BiPAP due to this.  Failed trial off BiPAP this  morning. > Positive for influenza A in the ED.  Leukocytosis 13.1 though this may be reactive.  Chest x-ray showed no acute abnormality. > Has received Solu-Medrol, DuoNeb, albuterol, Ativan, 500 cc IV fluid in the ED. - Monitor on progressive unit - Continue with BiPAP, wean as tolerated - Solu-Medrol today as patient is not tolerating p.o., Followed by prednisone tomorrow - Scheduled Atrovent - As needed albuterol - Trend fever curve and WBC - Consider procalcitonin if persistent leukocytosis  Hypertension - Holding amlodipine, losartan, hydrochlorothiazide, metoprolol while on BiPAP - Will add in as needed's if patient's blood pressure persistently greater than 180 systolic  Diabetes - SSI  PAD Carotid artery disease - Holding aspirin, Plavix, Zetia  OSA - On BiPAP  Depression Memory permanent Low back pain - Does not appear to be on medications for these.  DVT prophylaxis: Lovenox Code Status:   Full Family Communication:  Updated at bedside  Disposition Plan:   Patient is from:  Home  Anticipated DC to:  Home  Anticipated DC date:  2 to 4 days  Anticipated DC barriers: None  Consults called:  None Admission status:  Inpatient, progressive  Severity of Illness: The appropriate patient status for this patient is INPATIENT. Inpatient status is judged to be reasonable and necessary in order to provide the required intensity of service to ensure the patient's safety. The patient's presenting symptoms, physical exam findings, and initial radiographic and laboratory data in the context of their chronic comorbidities is felt to place them at high risk for further clinical deterioration. Furthermore, it is not anticipated that the patient will be medically stable for discharge from the hospital within 2 midnights of admission.   * I certify that at the point of admission it is my clinical judgment that the patient will require inpatient hospital care spanning beyond 2  midnights from the point of admission due to high intensity of service, high risk for further deterioration and high frequency of surveillance required.Synetta Fail MD [facility]  How to contact the [facility] Attending or Consulting provider 7A - 7P or covering provider during after hours 7P -7A, for this patient?   Check the care team in [facility] and look for a)  attending/consulting TR[facility] provider listed and b) the [facility] team listed Log into [email or web address] and use [place]'s universal password to access. If you do not have the password, please contact the hospital operator. Locate the [facility] provider you are looking for under [facility] and page to a number that you can be directly reached. If you still have difficulty reaching the provider, please page the [facility] (Director on Call) for the Hospitalists listed on amion for assistance.  03/17/2023, 12:58 PM

## 2023-03-17 NOTE — Progress Notes (Signed)
Pt placed on BiPAP by RT upon arrival to 5W. Pt tolerating well at this time. Vitals stable, spo2 96%, RN at bedside, RT will monitor as needed.

## 2023-03-17 NOTE — ED Notes (Signed)
ED TO INPATIENT HANDOFF REPORT  ED Nurse Name and Phone #: Judythe Postema, MSN, RN, PCCN  S Name/Age/Gender Lisa Zamora 68 y.o. female Room/Bed: MH10/MH10  Code Status   Code Status: Not on file  Home/SNF/Other Home Patient oriented to: self, place, time, and situation Is this baseline? Yes   Triage Complete: Triage complete  Chief Complaint Acute respiratory failure with hypoxia (HCC) [J96.01]  Triage Note Chest discomfort, shob, productive cough onset yesterday. Dx with URI yesterday at [facility]. Told to come to ER if sx worsened. Sx worsened today. No 02 at home. Patient 100% on 4L [place] with 25RR in triage. H/o copd and asthma.    Allergies Allergies  Allergen Reactions   Ezetimibe     Other Reaction(s): Other (See Comments)  Gas.   Hydrocodone Other (See Comments)    Psychosis/hallucinations/sleep walking.  Other Reaction(s): Altered mental status, Other (See Comments)  Psychosis/hallucinations/sleep walking.   Psychosis/hallucinations/sleep walking.   Statins     Other Reaction(s): Myalgias, Myalgias (intolerance)    Level of Care/Admitting Diagnosis ED Disposition     ED Disposition  Admit   Condition  --   Comment  Hospital Area: [facility] [[id number]]  Level of Care: Progressive [102]  Admit to Progressive based on following criteria: RESPIRATORY PROBLEMS hypoxemic/hypercapnic respiratory failure that is responsive to NIPPV (BiPAP) or High Flow Nasal Cannula (6-80 lpm). Frequent assessment/intervention, no > Q2 hrs < Q4 hrs, to maintain oxygenation and pulmonary hygiene.  Interfacility transfer: Yes  May place patient in observation at [facility] or [facility] if equivalent level of care is available:: No  Covid Evaluation: Asymptomatic - no recent exposure (last 10 days) testing not required  Diagnosis: Acute respiratory failure with hypoxia [facility]) [[id number]]  Admitting Physician: Synetta Fail [[id number]]  Attending Physician: Synetta Fail [phone]          B Medical/Surgery History Past Medical History:  Diagnosis Date   COPD (chronic obstructive pulmonary disease) (HCC)    Diabetes mellitus    Hypertension    Past Surgical History:  Procedure Laterality Date   ENDOSCOPIC STENT PLACEMENT W/ MLB       A IV Location/Drains/Wounds Patient Lines/Drains/Airways Status     Active Line/Drains/Airways     Name Placement date Placement time Site Days   Peripheral IV 03/16/23 20 G Anterior;Proximal;Right Forearm 03/16/23  1545  Forearm  1            Intake/Output Last 24 hours  Intake/Output Summary (Last 24 hours) at 03/17/2023 1114 Last data filed at 03/17/2023 0136 Gross per 24 hour  Intake 550 ml  Output --  Net 550 ml    Labs/Imaging Results for orders placed or performed during the hospital encounter of 03/16/23 (from the past 48 hours)  Resp panel by RT-PCR (RSV, Flu A&B, Covid) Anterior Nasal Swab     Status: Abnormal   Collection Time: 03/16/23  3:48 PM   Specimen: Anterior Nasal Swab  Result Value Ref Range   SARS Coronavirus 2 by RT PCR NEGATIVE NEGATIVE    Comment: (NOTE) SARS-CoV-2 target nucleic acids are NOT DETECTED.  The SARS-CoV-2 RNA is generally detectable in upper respiratory specimens during the acute phase of infection. The lowest concentration of SARS-CoV-2 viral copies this assay can detect is 138 copies/mL. A negative result does not preclude SARS-Cov-2 infection and should not be used as the sole basis for treatment or other patient management decisions. A negative result may occur with  improper  specimen collection/handling, submission of specimen other than nasopharyngeal swab, presence of viral mutation(s) within the areas targeted by this assay, and inadequate number of viral copies(<138 copies/mL). A negative result must be combined with clinical observations, patient history, and epidemiological information. The expected result is Negative.  Fact Sheet  for Patients:  BloggerCourse.com  Fact Sheet for Healthcare Providers:  SeriousBroker.it  This test is no t yet approved or cleared by the [place] FDA and  has been authorized for detection and/or diagnosis of SARS-CoV-2 by FDA under an Emergency Use Authorization (EUA). This EUA will remain  in effect (meaning this test can be used) for the duration of the COVID-19 declaration under Section 564(b)(1) of the Act, 21 U.S.C.section 360bbb-3(b)(1), unless the authorization is terminated  or revoked sooner.       Influenza A by PCR POSITIVE (A) NEGATIVE   Influenza B by PCR NEGATIVE NEGATIVE    Comment: (NOTE) The Xpert Xpress SARS-CoV-2/FLU/RSV plus assay is intended as an aid in the diagnosis of influenza from Nasopharyngeal swab specimens and should not be used as a sole basis for treatment. Nasal washings and aspirates are unacceptable for Xpert Xpress SARS-CoV-2/FLU/RSV testing.  Fact Sheet for Patients: BloggerCourse.com  Fact Sheet for Healthcare Providers: SeriousBroker.it  This test is not yet approved or cleared by the [place] FDA and has been authorized for detection and/or diagnosis of SARS-CoV-2 by FDA under an Emergency Use Authorization (EUA). This EUA will remain in effect (meaning this test can be used) for the duration of the COVID-19 declaration under Section 564(b)(1) of the Act, 21 U.S.C. section 360bbb-3(b)(1), unless the authorization is terminated or revoked.     Resp Syncytial Virus by PCR NEGATIVE NEGATIVE    Comment: (NOTE) Fact Sheet for Patients: BloggerCourse.com  Fact Sheet for Healthcare Providers: SeriousBroker.it  This test is not yet approved or cleared by the [place] FDA and has been authorized for detection and/or diagnosis of SARS-CoV-2 by FDA under an Emergency Use  Authorization (EUA). This EUA will remain in effect (meaning this test can be used) for the duration of the COVID-19 declaration under Section 564(b)(1) of the Act, 21 U.S.C. section 360bbb-3(b)(1), unless the authorization is terminated or revoked.  Performed at [facility], [address] [place], [place] [address]   CBC with Differential/Platelet     Status: Abnormal   Collection Time: 03/16/23  3:49 PM  Result Value Ref Range   WBC 13.1 (H) 4.0 - 10.5 K/uL   RBC 4.47 3.87 - 5.11 MIL/uL   Hemoglobin 13.2 12.0 - 15.0 g/dL   HCT 60.4 54.0 - 98.1 %   MCV 87.7 80.0 - 100.0 fL   MCH 29.5 26.0 - 34.0 pg   MCHC 33.7 30.0 - 36.0 g/dL   RDW 19.1 47.8 - 29.5 %   Platelets 268 150 - 400 K/uL   nRBC 0.0 0.0 - 0.2 %   Neutrophils Relative % 90 %   Neutro Abs 11.8 (H) 1.7 - 7.7 K/uL   Lymphocytes Relative 2 %   Lymphs Abs 0.3 (L) 0.7 - 4.0 K/uL   Monocytes Relative 7 %   Monocytes Absolute 0.9 0.1 - 1.0 K/uL   Eosinophils Relative 0 %   Eosinophils Absolute 0.0 0.0 - 0.5 K/uL   Basophils Relative 0 %   Basophils Absolute 0.0 0.0 - 0.1 K/uL   Immature Granulocytes 1 %   Abs Immature Granulocytes 0.06 0.00 - 0.07 K/uL    Comment: Performed at  [facility], [address] [place], [place] [address]  Basic metabolic panel     Status: Abnormal   Collection Time: 03/16/23  3:49 PM  Result Value Ref Range   Sodium 128 (L) 135 - 145 mmol/L   Potassium 3.8 3.5 - 5.1 mmol/L   Chloride 95 (L) 98 - 111 mmol/L   CO2 20 (L) 22 - 32 mmol/L   Glucose, Bld 224 (H) 70 - 99 mg/dL    Comment: Glucose reference range applies only to samples taken after fasting for at least 8 hours.   BUN 16 8 - 23 mg/dL   Creatinine, Ser 6.04 0.44 - 1.00 mg/dL   Calcium 9.8 8.9 - 54.0 mg/dL   GFR, Estimated >98 >11 mL/min    Comment: (NOTE) Calculated using the CKD-EPI Creatinine Equation (2021)    Anion gap 13 5 - 15    Comment: Performed at [facility], [address]  [place], [place] [address]  I-Stat venous blood gas, ([facility] ED, [facility], [facility])     Status: Abnormal   Collection Time: 03/16/23  4:00 PM  Result Value Ref Range   pH, Ven 7.348 7.25 - 7.43   pCO2, Ven 40.7 (L) 44 - 60 mmHg   pO2, Ven 81 (H) 32 - 45 mmHg   Bicarbonate 22.4 20.0 - 28.0 mmol/L   TCO2 24 22 - 32 mmol/L   O2 Saturation 95 %   Acid-base deficit 3.0 (H) 0.0 - 2.0 mmol/L   Sodium 132 (L) 135 - 145 mmol/L   Potassium 3.5 3.5 - 5.1 mmol/L   Calcium, Ion 1.24 1.15 - 1.40 mmol/L   HCT 42.0 36.0 - 46.0 %   Hemoglobin 14.3 12.0 - 15.0 g/dL   Sample type VENOUS   Urinalysis, Routine w reflex microscopic -Urine, Clean Catch     Status: Abnormal   Collection Time: 03/16/23  6:06 PM  Result Value Ref Range   Color, Urine YELLOW YELLOW   APPearance CLEAR CLEAR   Specific Gravity, Urine 1.020 1.005 - 1.030   pH 5.5 5.0 - 8.0   Glucose, UA 100 (A) NEGATIVE mg/dL   Hgb urine dipstick NEGATIVE NEGATIVE   Bilirubin Urine NEGATIVE NEGATIVE   Ketones, ur NEGATIVE NEGATIVE mg/dL   Protein, ur NEGATIVE NEGATIVE mg/dL   Nitrite NEGATIVE NEGATIVE   Leukocytes,Ua SMALL (A) NEGATIVE    Comment: Performed at [facility], [address] [place], [place] [address]  Urinalysis, Microscopic (reflex)     Status: Abnormal   Collection Time: 03/16/23  6:06 PM  Result Value Ref Range   RBC / HPF 0-5 0 - 5 RBC/hpf   WBC, UA 6-10 0 - 5 WBC/hpf   Bacteria, UA RARE (A) NONE SEEN   Squamous Epithelial / HPF 0-5 0 - 5 /HPF    Comment: Performed at [facility], [address] [place], [place] [address]  CBG monitoring, ED     Status: Abnormal   Collection Time: 03/17/23  7:06 AM  Result Value Ref Range   Glucose-Capillary 181 (H) 70 - 99 mg/dL    Comment: Glucose reference range applies only to samples taken after fasting for at least 8 hours.   DG Chest Port 1 View Result Date: 03/16/2023 CLINICAL DATA:  Shortness of breath. EXAM: PORTABLE CHEST 1 VIEW COMPARISON:  01/22/2022 and CT chest  12/18/2009. FINDINGS: Trachea is midline. Heart is at the upper limits of normal in size, stable.  Thoracic aorta is calcified. Minimal streaky scarring in the left lung base. Calcified granulomas. Lungs are otherwise clear. No pleural fluid. IMPRESSION: No acute findings. Electronically Signed   By: Leanna Battles M.D.   On: 03/16/2023 16:13    Pending Labs Unresulted Labs (From admission, onward)    None       Vitals/Pain Today's Vitals   03/17/23 1004 03/17/23 1015 03/17/23 1024 03/17/23 1030  BP:  (!) 149/70  123/71  Pulse:  100  97  Resp:  (!) 28  (!) 27  Temp:      TempSrc:      SpO2: 98% 99%    Weight:      Height:      PainSc:   0-No pain     Isolation Precautions No active isolations  Medications Medications  albuterol (PROVENTIL) (2.5 MG/3ML) 0.083% nebulizer solution 10 mg (0 mg Nebulization Stopped 03/16/23 1913)  ipratropium-albuterol (DUONEB) 0.5-2.5 (3) MG/3ML nebulizer solution 3 mL (3 mLs Nebulization Given 03/17/23 1000)  ipratropium-albuterol (DUONEB) 0.5-2.5 (3) MG/3ML nebulizer solution 3 mL (3 mLs Nebulization Given 03/16/23 1536)  albuterol (PROVENTIL) (2.5 MG/3ML) 0.083% nebulizer solution 2.5 mg (2.5 mg Nebulization Given 03/16/23 1536)  sodium chloride 0.9 % bolus 500 mL (0 mLs Intravenous Stopped 03/16/23 2019)  methylPREDNISolone sodium succinate (SOLU-MEDROL) 125 mg/2 mL injection 125 mg (125 mg Intravenous Given 03/16/23 1802)  LORazepam (ATIVAN) injection 1 mg (1 mg Intravenous Given 03/16/23 1801)  amLODipine (NORVASC) tablet 10 mg (10 mg Oral Given 03/16/23 2023)  magnesium sulfate IVPB 2 g 50 mL (0 g Intravenous Stopped 03/17/23 0136)  LORazepam (ATIVAN) injection 1 mg (1 mg Intravenous Given 03/17/23 0140)    Mobility Non-Ambulatory r/t condition     Focused Assessments Pulmonary Assessment Handoff:  Lung sounds: Bilateral Breath Sounds: Expiratory wheezes L Breath Sounds: Diminished R Breath Sounds: Expiratory wheezes, Diminished O2 Device: Venturi  Mask O2 Flow Rate (L/min): 4 L/min    R Recommendations: See Admitting Provider Note  Report given to: Ranjita, RN  Additional Notes: N/A

## 2023-03-17 NOTE — ED Notes (Signed)
 Called [facility] for transport.

## 2023-03-17 NOTE — ED Notes (Signed)
[facility] at bedside to assume care.

## 2023-03-17 NOTE — ED Notes (Signed)
Increased work of breath after 1 hour off NIV/BiPAP.  Returned to previous settings and given extra duoneb.  BBS E/wheeze decreased. RR 34 before placing back on bipap.

## 2023-03-17 NOTE — Plan of Care (Signed)
  Problem: Education: Goal: Knowledge of disease or condition will improve Outcome: Progressing Goal: Knowledge of the prescribed therapeutic regimen will improve Outcome: Progressing   Problem: Activity: Goal: Ability to tolerate increased activity will improve Outcome: Progressing Goal: Will verbalize the importance of balancing activity with adequate rest periods Outcome: Progressing   Problem: Respiratory: Goal: Ability to maintain a clear airway will improve Outcome: Progressing Goal: Levels of oxygenation will improve Outcome: Progressing Goal: Ability to maintain adequate ventilation will improve Outcome: Progressing   Problem: Metabolic: Goal: Ability to maintain appropriate glucose levels will improve Outcome: Progressing   Problem: Nutritional: Goal: Maintenance of adequate nutrition will improve Outcome: Progressing   Problem: Skin Integrity: Goal: Risk for impaired skin integrity will decrease Outcome: Progressing

## 2023-03-18 ENCOUNTER — Inpatient Hospital Stay (HOSPITAL_COMMUNITY): Payer: Medicare PPO

## 2023-03-18 DIAGNOSIS — J441 Chronic obstructive pulmonary disease with (acute) exacerbation: Secondary | ICD-10-CM | POA: Diagnosis not present

## 2023-03-18 DIAGNOSIS — E119 Type 2 diabetes mellitus without complications: Secondary | ICD-10-CM

## 2023-03-18 DIAGNOSIS — J101 Influenza due to other identified influenza virus with other respiratory manifestations: Secondary | ICD-10-CM | POA: Diagnosis not present

## 2023-03-18 DIAGNOSIS — J9601 Acute respiratory failure with hypoxia: Secondary | ICD-10-CM

## 2023-03-18 LAB — RESPIRATORY PANEL BY PCR

## 2023-03-18 LAB — CBC
HCT: 38.7 % (ref 36.0–46.0)
Hemoglobin: 12.5 g/dL (ref 12.0–15.0)
MCH: 29.8 pg (ref 26.0–34.0)
MCHC: 32.3 g/dL (ref 30.0–36.0)
MCV: 92.1 fL (ref 80.0–100.0)
Platelets: 282 10*3/uL (ref 150–400)
RBC: 4.2 MIL/uL (ref 3.87–5.11)
RDW: 14.1 % (ref 11.5–15.5)
WBC: 12.7 10*3/uL — ABNORMAL HIGH (ref 4.0–10.5)
nRBC: 0 % (ref 0.0–0.2)

## 2023-03-18 LAB — COMPREHENSIVE METABOLIC PANEL
ALT: 47 U/L — ABNORMAL HIGH (ref 0–44)
AST: 50 U/L — ABNORMAL HIGH (ref 15–41)
Albumin: 3.2 g/dL — ABNORMAL LOW (ref 3.5–5.0)
Alkaline Phosphatase: 57 U/L (ref 38–126)
Anion gap: 8 (ref 5–15)
BUN: 18 mg/dL (ref 8–23)
CO2: 28 mmol/L (ref 22–32)
Calcium: 9.1 mg/dL (ref 8.9–10.3)
Chloride: 99 mmol/L (ref 98–111)
Creatinine, Ser: 0.8 mg/dL (ref 0.44–1.00)
GFR, Estimated: >60 mL/min (ref >60)
Glucose, Bld: 126 mg/dL — ABNORMAL HIGH (ref 70–99)
Potassium: 4.2 mmol/L (ref 3.5–5.1)
Sodium: 135 mmol/L (ref 135–145)
Total Bilirubin: 0.5 mg/dL (ref 0.0–1.2)
Total Protein: 6.5 g/dL (ref 6.5–8.1)

## 2023-03-18 LAB — BRAIN NATRIURETIC PEPTIDE: B Natriuretic Peptide: 283.8 pg/mL — ABNORMAL HIGH (ref 0.0–100.0)

## 2023-03-18 LAB — GLUCOSE, CAPILLARY
Glucose-Capillary: 115 mg/dL — ABNORMAL HIGH (ref 70–99)
Glucose-Capillary: 128 mg/dL — ABNORMAL HIGH (ref 70–99)
Glucose-Capillary: 128 mg/dL — ABNORMAL HIGH (ref 70–99)
Glucose-Capillary: 130 mg/dL — ABNORMAL HIGH (ref 70–99)
Glucose-Capillary: 158 mg/dL — ABNORMAL HIGH (ref 70–99)
Glucose-Capillary: 176 mg/dL — ABNORMAL HIGH (ref 70–99)
Glucose-Capillary: 243 mg/dL — ABNORMAL HIGH (ref 70–99)

## 2023-03-18 MED ORDER — ARFORMOTEROL TARTRATE 15 MCG/2ML IN NEBU
15.0000 ug | INHALATION_SOLUTION | Freq: Two times a day (BID) | RESPIRATORY_TRACT | Status: DC
  Administered 2023-03-18 – 2023-03-21 (×7): 15 ug via RESPIRATORY_TRACT
  Filled 2023-03-18 (×7): qty 2

## 2023-03-18 MED ORDER — ENALAPRILAT 1.25 MG/ML IV SOLN
1.2500 mg | Freq: Four times a day (QID) | INTRAVENOUS | Status: DC
  Administered 2023-03-18 – 2023-03-19 (×4): 1.25 mg via INTRAVENOUS
  Filled 2023-03-18 (×5): qty 1

## 2023-03-18 MED ORDER — FUROSEMIDE 10 MG/ML IJ SOLN
40.0000 mg | Freq: Once | INTRAMUSCULAR | Status: AC
  Administered 2023-03-18: 40 mg via INTRAVENOUS
  Filled 2023-03-18: qty 4

## 2023-03-18 MED ORDER — METHYLPREDNISOLONE SODIUM SUCC 125 MG IJ SOLR
60.0000 mg | Freq: Two times a day (BID) | INTRAMUSCULAR | Status: DC
Start: 2023-03-18 — End: 2023-03-19
  Administered 2023-03-18 – 2023-03-19 (×3): 60 mg via INTRAVENOUS
  Filled 2023-03-18 (×3): qty 2

## 2023-03-18 MED ORDER — LABETALOL HCL 5 MG/ML IV SOLN
10.0000 mg | Freq: Four times a day (QID) | INTRAVENOUS | Status: DC | PRN
  Administered 2023-03-18 – 2023-03-20 (×2): 10 mg via INTRAVENOUS
  Filled 2023-03-18 (×3): qty 4

## 2023-03-18 MED ORDER — PANTOPRAZOLE SODIUM 40 MG IV SOLR
40.0000 mg | INTRAVENOUS | Status: DC
Start: 2023-03-18 — End: 2023-03-19
  Administered 2023-03-18: 40 mg via INTRAVENOUS
  Filled 2023-03-18: qty 10

## 2023-03-18 MED ORDER — HYDRALAZINE HCL 20 MG/ML IJ SOLN
10.0000 mg | Freq: Four times a day (QID) | INTRAMUSCULAR | Status: DC | PRN
  Administered 2023-03-20: 10 mg via INTRAVENOUS
  Filled 2023-03-18 (×2): qty 1

## 2023-03-18 MED ORDER — OSELTAMIVIR PHOSPHATE 30 MG PO CAPS
30.0000 mg | ORAL_CAPSULE | Freq: Two times a day (BID) | ORAL | Status: DC
  Administered 2023-03-19 – 2023-03-21 (×5): 30 mg via ORAL
  Filled 2023-03-18 (×5): qty 1

## 2023-03-18 MED ORDER — IPRATROPIUM-ALBUTEROL 0.5-2.5 (3) MG/3ML IN SOLN
3.0000 mL | Freq: Four times a day (QID) | RESPIRATORY_TRACT | Status: DC
  Administered 2023-03-18 – 2023-03-19 (×5): 3 mL via RESPIRATORY_TRACT
  Filled 2023-03-18 (×5): qty 3

## 2023-03-18 MED ORDER — ALPRAZOLAM 0.25 MG PO TABS
0.2500 mg | ORAL_TABLET | Freq: Two times a day (BID) | ORAL | Status: DC | PRN
  Administered 2023-03-18 – 2023-03-20 (×3): 0.25 mg via ORAL
  Filled 2023-03-18 (×3): qty 1

## 2023-03-18 MED ORDER — BUDESONIDE 0.25 MG/2ML IN SUSP
0.2500 mg | Freq: Two times a day (BID) | RESPIRATORY_TRACT | Status: DC
  Administered 2023-03-18 – 2023-03-21 (×7): 0.25 mg via RESPIRATORY_TRACT
  Filled 2023-03-18 (×7): qty 2

## 2023-03-18 MED ORDER — REVEFENACIN 175 MCG/3ML IN SOLN
175.0000 ug | Freq: Every day | RESPIRATORY_TRACT | Status: DC
  Administered 2023-03-18 – 2023-03-21 (×4): 175 ug via RESPIRATORY_TRACT
  Filled 2023-03-18 (×4): qty 3

## 2023-03-18 MED ORDER — LORAZEPAM 2 MG/ML IJ SOLN
0.5000 mg | Freq: Once | INTRAMUSCULAR | Status: AC
  Administered 2023-03-18: 0.5 mg via INTRAVENOUS
  Filled 2023-03-18: qty 1

## 2023-03-18 MED ORDER — METOPROLOL TARTRATE 5 MG/5ML IV SOLN
2.5000 mg | INTRAVENOUS | Status: DC | PRN
Start: 2023-03-18 — End: 2023-03-21

## 2023-03-18 MED ORDER — OSELTAMIVIR PHOSPHATE 75 MG PO CAPS
75.0000 mg | ORAL_CAPSULE | Freq: Once | ORAL | Status: AC
Start: 2023-03-18 — End: 2023-03-18
  Administered 2023-03-18: 75 mg via ORAL
  Filled 2023-03-18: qty 1

## 2023-03-18 NOTE — Progress Notes (Addendum)
PROGRESS NOTE        PATIENT DETAILS Name: Lisa Zamora Age: 68 y.o. Sex: female Date of Birth: July 26, 1955 Admit Date: 03/16/2023 Admitting Physician Synetta Fail, MD [id number], Jennye Moccasin, MD  Brief Summary: Patient is a 68 y.o.  female with history of COPD, HTN, DM-2, PAD-who presented with worsening shortness of breath-she was found to have acute COPD exacerbation in the setting of influenza infection.  Significant events: 2/1>> admit to [facility]-COPD exacerbation-hypoxic-requiring BiPAP-influenza A+.  Significant studies: 2/1>> CXR: No PNA  Significant microbiology data: 2/1>> influenza A: Positive 2/1>> COVID/influenza B/RSV PCR: Negative  Procedures: None  Consults: None  Subjective: When I saw her this morning-she wanted her BiPAP removed-which was briefly removed-however she redeveloped shortness of breath.  She is moving air-anxiety seems to be a big component as well.  Claims that she is not as "tight" as yesterday.  Objective: Vitals: Blood pressure (!) 152/87, pulse 93, temperature 98 F (36.7 C), temperature source Axillary, resp. rate 20, height 5\' 3"  (1.6 m), weight 61.7 kg, SpO2 100%.   Exam: Gen Exam:Alert awake-not in any distress HEENT:atraumatic, normocephalic Chest: Moving air well-still with coarse rhonchi all over. CVS:S1S2 regular Abdomen:soft non tender, non distended Extremities:no edema Neurology: Non focal Skin: no rash  Pertinent Labs/Radiology:    Latest Ref Rng & Units 03/18/2023    4:53 AM 03/17/2023    1:13 PM 03/16/2023    4:00 PM  CBC  WBC 4.0 - 10.5 K/uL 12.7  13.0    Hemoglobin 12.0 - 15.0 g/dL 56.3  87.5  64.3   Hematocrit 36.0 - 46.0 % 38.7  39.1  42.0   Platelets 150 - 400 K/uL 282  263      Lab Results  Component Value Date   NA 135 03/18/2023   K 4.2 03/18/2023   CL 99 03/18/2023   CO2 28 03/18/2023      Assessment/Plan: Acute hypoxic respiratory failure due to COPD exacerbation from  influenza A infection Overall better but still requiring BiPAP this morning-failed liberation.  Moving air but still wheezing on exam.   Switch back to IV steroids Scheduled bronchodilators Continue Tamiflu Anxiety plain role-will add as needed benzodiazepines for now Suspect will require BiPAP intermittently over the next day or so. Watch closely-if no improvement-May need to touch base with PCCM at some point.  HTN Uncontrolled Suspect secondary to anxiety/IVF Saline lock all IVF Since on BiPAP-not able to take any oral antihypertensives-will start scheduled IV Vasotec As needed IV hydralazine Treat underlying anxiety with anxiolytics Follow BP trend and adjust medications accordingly  DM-2 (A1c 7.2 on 2/2) CBG stable-continue SSI Resume oral hypoglycemic agents on discharge.  Recent Labs    03/18/23 0009 03/18/23 0351 03/18/23 0725  GLUCAP 128* 130* 115*    PAD Stable ASA/Plavix/Zetia when able.  GERD PPI   BMI: Estimated body mass index is 24.09 kg/m as calculated from the following:   Height as of this encounter: 5\' 3"  (1.6 m).   Weight as of this encounter: 61.7 kg.   Code status:   Code Status: Full Code   DVT Prophylaxis: enoxaparin (LOVENOX) injection 40 mg Start: 03/17/23 1345   Family Communication: Daughter-[name]-called on 2/3   Disposition Plan: Status is: Inpatient Remains inpatient appropriate because: Severity of illness   Planned Discharge Destination:Home health  The patient is critically ill with  multiple organ system failure and requires high complexity decision making for assessment and support, frequent evaluation and titration of therapies, advanced monitoring, review of radiographic studies and interpretation of complex data.   Total Critical time spent equals 45 minutes   Diet: Diet Order             Diet NPO time specified  Diet effective now                     Antimicrobial  agents: Anti-infectives (From admission, onward)    Start     Dose/Rate Route Frequency Ordered Stop   03/18/23 1000  oseltamivir (TAMIFLU) capsule 30 mg       Placed in "Followed by" Linked Group   30 mg Oral 2 times daily 03/17/23 1520 03/23/23 0959   03/17/23 2200  oseltamivir (TAMIFLU) capsule 75 mg       Placed in "Followed by" Linked Group   75 mg Oral  Once 03/17/23 1520          MEDICATIONS: Scheduled Meds:  arformoterol  15 mcg Nebulization BID   budesonide (PULMICORT) nebulizer solution  0.25 mg Nebulization BID   enalaprilat  1.25 mg Intravenous Q6H   enoxaparin (LOVENOX) injection  40 mg Subcutaneous Q24H   insulin aspart  0-6 Units Subcutaneous Q4H   ipratropium-albuterol  3 mL Nebulization Q6H   methylPREDNISolone (SOLU-MEDROL) injection  60 mg Intravenous Q12H   montelukast  10 mg Oral QHS   oseltamivir  75 mg Oral Once   Followed by   oseltamivir  30 mg Oral BID   revefenacin  175 mcg Nebulization Daily   sodium chloride flush  3 mL Intravenous Q12H   Continuous Infusions:  acetaminophen Stopped (03/18/23 0225)   PRN Meds:.acetaminophen, albuterol, hydrALAZINE, labetalol, polyethylene glycol   I have personally reviewed following labs and imaging studies  LABORATORY DATA: CBC: Recent Labs  Lab 03/16/23 1549 03/16/23 1600 03/17/23 1313 03/18/23 0453  WBC 13.1*  --  13.0* 12.7*  NEUTROABS 11.8*  --   --   --   HGB 13.2 14.3 12.8 12.5  HCT 39.2 42.0 39.1 38.7  MCV 87.7  --  90.3 92.1  PLT 268  --  263 282    Basic Metabolic Panel: Recent Labs  Lab 03/16/23 1549 03/16/23 1600 03/17/23 1313 03/18/23 0453  NA 128* 132* 135 135  K 3.8 3.5 4.3 4.2  CL 95*  --  96* 99  CO2 20*  --  29 28  GLUCOSE 224*  --  147* 126*  BUN 16  --  14 18  CREATININE 0.90  --  1.01* 0.80  CALCIUM 9.8  --  9.4 9.1    GFR: Estimated Creatinine Clearance: 56.4 mL/min (by C-G formula based on SCr of 0.8 mg/dL).  Liver Function Tests: Recent Labs  Lab  03/17/23 1313 03/18/23 0453  AST 41 50*  ALT 31 47*  ALKPHOS 60 57  BILITOT 0.5 0.5  PROT 6.6 6.5  ALBUMIN 3.3* 3.2*   No results for input(s): "LIPASE", "AMYLASE" in the last 168 hours. No results for input(s): "AMMONIA" in the last 168 hours.  Coagulation Profile: No results for input(s): "INR", "PROTIME" in the last 168 hours.  Cardiac Enzymes: No results for input(s): "CKTOTAL", "CKMB", "CKMBINDEX", "TROPONINI" in the last 168 hours.  BNP (last 3 results) No results for input(s): "PROBNP" in the last 8760 hours.  Lipid Profile: No results for input(s): "CHOL", "HDL", "LDLCALC", "TRIG", "CHOLHDL", "LDLDIRECT" in the  last 72 hours.  Thyroid Function Tests: No results for input(s): "TSH", "T4TOTAL", "FREET4", "T3FREE", "THYROIDAB" in the last 72 hours.  Anemia Panel: No results for input(s): "VITAMINB12", "FOLATE", "FERRITIN", "TIBC", "IRON", "RETICCTPCT" in the last 72 hours.  Urine analysis:    Component Value Date/Time   COLORURINE YELLOW 03/16/2023 1806   APPEARANCEUR CLEAR 03/16/2023 1806   LABSPEC 1.020 03/16/2023 1806   PHURINE 5.5 03/16/2023 1806   GLUCOSEU 100 (A) 03/16/2023 1806   HGBUR NEGATIVE 03/16/2023 1806   BILIRUBINUR NEGATIVE 03/16/2023 1806   KETONESUR NEGATIVE 03/16/2023 1806   PROTEINUR NEGATIVE 03/16/2023 1806   UROBILINOGEN 0.2 12/18/2009 1207   NITRITE NEGATIVE 03/16/2023 1806   LEUKOCYTESUR SMALL (A) 03/16/2023 1806    Sepsis Labs: Lactic Acid, Venous    Component Value Date/Time   LATICACIDVEN 1.76 11/14/2017 1601    MICROBIOLOGY: Recent Results (from the past 240 hours)  Resp panel by RT-PCR (RSV, Flu A&B, Covid) Anterior Nasal Swab     Status: Abnormal   Collection Time: 03/16/23  3:48 PM   Specimen: Anterior Nasal Swab  Result Value Ref Range Status   SARS Coronavirus 2 by RT PCR NEGATIVE NEGATIVE Final    Comment: (NOTE) SARS-CoV-2 target nucleic acids are NOT DETECTED.  The SARS-CoV-2 RNA is generally detectable in upper  respiratory specimens during the acute phase of infection. The lowest concentration of SARS-CoV-2 viral copies this assay can detect is 138 copies/mL. A negative result does not preclude SARS-Cov-2 infection and should not be used as the sole basis for treatment or other patient management decisions. A negative result may occur with  improper specimen collection/handling, submission of specimen other than nasopharyngeal swab, presence of viral mutation(s) within the areas targeted by this assay, and inadequate number of viral copies(<138 copies/mL). A negative result must be combined with clinical observations, patient history, and epidemiological information. The expected result is Negative.  Fact Sheet for Patients:  BloggerCourse.com  Fact Sheet for Healthcare Providers:  SeriousBroker.it  This test is no t yet approved or cleared by the [place] FDA and  has been authorized for detection and/or diagnosis of SARS-CoV-2 by FDA under an Emergency Use Authorization (EUA). This EUA will remain  in effect (meaning this test can be used) for the duration of the COVID-19 declaration under Section 564(b)(1) of the Act, 21 U.S.C.section 360bbb-3(b)(1), unless the authorization is terminated  or revoked sooner.       Influenza A by PCR POSITIVE (A) NEGATIVE Final   Influenza B by PCR NEGATIVE NEGATIVE Final    Comment: (NOTE) The Xpert Xpress SARS-CoV-2/FLU/RSV plus assay is intended as an aid in the diagnosis of influenza from Nasopharyngeal swab specimens and should not be used as a sole basis for treatment. Nasal washings and aspirates are unacceptable for Xpert Xpress SARS-CoV-2/FLU/RSV testing.  Fact Sheet for Patients: BloggerCourse.com  Fact Sheet for Healthcare Providers: SeriousBroker.it  This test is not yet approved or cleared by the [place] FDA and has been  authorized for detection and/or diagnosis of SARS-CoV-2 by FDA under an Emergency Use Authorization (EUA). This EUA will remain in effect (meaning this test can be used) for the duration of the COVID-19 declaration under Section 564(b)(1) of the Act, 21 U.S.C. section 360bbb-3(b)(1), unless the authorization is terminated or revoked.     Resp Syncytial Virus by PCR NEGATIVE NEGATIVE Final    Comment: (NOTE) Fact Sheet for Patients: BloggerCourse.com  Fact Sheet for Healthcare Providers: SeriousBroker.it  This test is not yet approved or  cleared by the [place] and has been authorized for detection and/or diagnosis of SARS-CoV-2 by FDA under an Emergency Use Authorization (EUA). This EUA will remain in effect (meaning this test can be used) for the duration of the COVID-19 declaration under Section 564(b)(1) of the Act, 21 U.S.C. section 360bbb-3(b)(1), unless the authorization is terminated or revoked.  Performed at [facility], [address] [place], [place] [address]     RADIOLOGY STUDIES/RESULTS: DG Chest Port 1 View Result Date: 03/16/2023 CLINICAL DATA:  Shortness of breath. EXAM: PORTABLE CHEST 1 VIEW COMPARISON:  01/22/2022 and CT chest 12/18/2009. FINDINGS: Trachea is midline. Heart is at the upper limits of normal in size, stable. Thoracic aorta is calcified. Minimal streaky scarring in the left lung base. Calcified granulomas. Lungs are otherwise clear. No pleural fluid. IMPRESSION: No acute findings. Electronically Signed   By: Leanna Battles M.D.   On: 03/16/2023 16:13     LOS: 1 day   Jeoffrey Massed, MD  [facility]    To contact the attending provider between 7A-7P or the covering provider during after hours 7P-7A, please log into the web site [email or web address] and access using universal [place] password for that web site. If you do not have the password, please call the hospital  operator.  03/18/2023, 9:12 AM

## 2023-03-18 NOTE — Progress Notes (Signed)
Per discussion w/ RN- RN discussed w/ MD for pt to have trial off bipap.  Currently pt on 5 lpm [place], no distress currently noted.  Per pt- she feels like her breathing is "doing okay".  Sat 96-97%

## 2023-03-18 NOTE — Progress Notes (Signed)
Patient has been off BIPAP since 1425.Sats 95 to 99% stable on 5l of oxygen.Xanax pill given for anxiety.

## 2023-03-18 NOTE — Progress Notes (Signed)
Patient's heart rate has been on 120s.MD made aware.

## 2023-03-18 NOTE — Plan of Care (Signed)
  Problem: Health Behavior/Discharge Planning: Goal: Ability to manage health-related needs will improve Outcome: Progressing   Problem: Clinical Measurements: Goal: Ability to maintain clinical measurements within normal limits will improve Outcome: Progressing Goal: Will remain free from infection Outcome: Progressing Goal: Respiratory complications will improve Outcome: Progressing   Problem: Activity: Goal: Risk for activity intolerance will decrease Outcome: Progressing   Problem: Coping: Goal: Level of anxiety will decrease Outcome: Progressing   Problem: Education: Goal: Knowledge of disease or condition will improve Outcome: Progressing Goal: Knowledge of the prescribed therapeutic regimen will improve Outcome: Progressing   Problem: Respiratory: Goal: Levels of oxygenation will improve Outcome: Progressing

## 2023-03-18 NOTE — Plan of Care (Signed)
  Problem: Education: Goal: Knowledge of disease or condition will improve Outcome: Progressing Goal: Knowledge of the prescribed therapeutic regimen will improve Outcome: Progressing Goal: Individualized Educational Video(s) Outcome: Progressing   Problem: Activity: Goal: Ability to tolerate increased activity will improve Outcome: Progressing   Problem: Respiratory: Goal: Ability to maintain a clear airway will improve Outcome: Progressing Goal: Levels of oxygenation will improve Outcome: Progressing Goal: Ability to maintain adequate ventilation will improve Outcome: Progressing

## 2023-03-19 ENCOUNTER — Inpatient Hospital Stay (HOSPITAL_COMMUNITY): Payer: Medicare PPO

## 2023-03-19 DIAGNOSIS — R0602 Shortness of breath: Secondary | ICD-10-CM | POA: Diagnosis not present

## 2023-03-19 DIAGNOSIS — J9601 Acute respiratory failure with hypoxia: Secondary | ICD-10-CM | POA: Diagnosis not present

## 2023-03-19 DIAGNOSIS — J441 Chronic obstructive pulmonary disease with (acute) exacerbation: Secondary | ICD-10-CM | POA: Diagnosis not present

## 2023-03-19 DIAGNOSIS — E119 Type 2 diabetes mellitus without complications: Secondary | ICD-10-CM | POA: Diagnosis not present

## 2023-03-19 DIAGNOSIS — I10 Essential (primary) hypertension: Secondary | ICD-10-CM | POA: Diagnosis not present

## 2023-03-19 LAB — ECHOCARDIOGRAM COMPLETE
Area-P 1/2: 5.52 cm2
Calc EF: 69 %
Height: 63 in
S' Lateral: 2.8 cm
Single Plane A2C EF: 72.2 %
Single Plane A4C EF: 68.1 %
Weight: 2176 [oz_av]

## 2023-03-19 LAB — BASIC METABOLIC PANEL
Anion gap: 12 (ref 5–15)
BUN: 19 mg/dL (ref 8–23)
CO2: 31 mmol/L (ref 22–32)
Calcium: 9.3 mg/dL (ref 8.9–10.3)
Chloride: 94 mmol/L — ABNORMAL LOW (ref 98–111)
Creatinine, Ser: 0.92 mg/dL (ref 0.44–1.00)
GFR, Estimated: >60 mL/min (ref >60)
Glucose, Bld: 149 mg/dL — ABNORMAL HIGH (ref 70–99)
Potassium: 4.3 mmol/L (ref 3.5–5.1)
Sodium: 137 mmol/L (ref 135–145)

## 2023-03-19 LAB — GLUCOSE, CAPILLARY
Glucose-Capillary: 170 mg/dL — ABNORMAL HIGH (ref 70–99)
Glucose-Capillary: 168 mg/dL — ABNORMAL HIGH (ref 70–99)
Glucose-Capillary: 164 mg/dL — ABNORMAL HIGH (ref 70–99)
Glucose-Capillary: 255 mg/dL — ABNORMAL HIGH (ref 70–99)
Glucose-Capillary: 305 mg/dL — ABNORMAL HIGH (ref 70–99)
Glucose-Capillary: 256 mg/dL — ABNORMAL HIGH (ref 70–99)

## 2023-03-19 LAB — CBC
HCT: 41.7 % (ref 36.0–46.0)
Hemoglobin: 13.3 g/dL (ref 12.0–15.0)
MCH: 29.5 pg (ref 26.0–34.0)
MCHC: 31.9 g/dL (ref 30.0–36.0)
MCV: 92.5 fL (ref 80.0–100.0)
Platelets: 277 10*3/uL (ref 150–400)
RBC: 4.51 MIL/uL (ref 3.87–5.11)
RDW: 13.8 % (ref 11.5–15.5)
WBC: 7.9 10*3/uL (ref 4.0–10.5)
nRBC: 0 % (ref 0.0–0.2)

## 2023-03-19 MED ORDER — CLOPIDOGREL BISULFATE 75 MG PO TABS
75.0000 mg | ORAL_TABLET | Freq: Every day | ORAL | Status: DC
  Administered 2023-03-19 – 2023-03-21 (×3): 75 mg via ORAL
  Filled 2023-03-19 (×3): qty 1

## 2023-03-19 MED ORDER — AMLODIPINE BESYLATE 10 MG PO TABS
10.0000 mg | ORAL_TABLET | Freq: Every day | ORAL | Status: DC
  Administered 2023-03-19 – 2023-03-21 (×3): 10 mg via ORAL
  Filled 2023-03-19 (×3): qty 1

## 2023-03-19 MED ORDER — EZETIMIBE 10 MG PO TABS
10.0000 mg | ORAL_TABLET | Freq: Every day | ORAL | Status: DC
Start: 2023-03-19 — End: 2023-03-21
  Administered 2023-03-19 – 2023-03-21 (×3): 10 mg via ORAL
  Filled 2023-03-19 (×3): qty 1

## 2023-03-19 MED ORDER — LOSARTAN POTASSIUM 50 MG PO TABS
100.0000 mg | ORAL_TABLET | Freq: Every day | ORAL | Status: DC
  Administered 2023-03-19 – 2023-03-21 (×3): 100 mg via ORAL
  Filled 2023-03-19 (×3): qty 2

## 2023-03-19 MED ORDER — INSULIN ASPART 100 UNIT/ML IJ SOLN
0.0000 [IU] | Freq: Three times a day (TID) | INTRAMUSCULAR | Status: DC
  Administered 2023-03-19: 5 [IU] via SUBCUTANEOUS

## 2023-03-19 MED ORDER — ASPIRIN 81 MG PO TBEC
81.0000 mg | DELAYED_RELEASE_TABLET | Freq: Every day | ORAL | Status: DC
  Administered 2023-03-19 – 2023-03-21 (×3): 81 mg via ORAL
  Filled 2023-03-19 (×3): qty 1

## 2023-03-19 MED ORDER — METOPROLOL SUCCINATE ER 50 MG PO TB24
50.0000 mg | ORAL_TABLET | Freq: Every day | ORAL | Status: DC
  Administered 2023-03-19 – 2023-03-21 (×3): 50 mg via ORAL
  Filled 2023-03-19 (×3): qty 1

## 2023-03-19 MED ORDER — ASPIRIN 325 MG PO TBEC: 325.0000 mg | DELAYED_RELEASE_TABLET | Freq: Every day | ORAL | Status: DC

## 2023-03-19 MED ORDER — PANTOPRAZOLE SODIUM 40 MG PO TBEC
40.0000 mg | DELAYED_RELEASE_TABLET | Freq: Every day | ORAL | Status: DC
  Administered 2023-03-19 – 2023-03-21 (×3): 40 mg via ORAL
  Filled 2023-03-19 (×3): qty 1

## 2023-03-19 MED ORDER — METHYLPREDNISOLONE SODIUM SUCC 40 MG IJ SOLR
40.0000 mg | Freq: Two times a day (BID) | INTRAMUSCULAR | Status: DC
  Administered 2023-03-19: 40 mg via INTRAVENOUS
  Filled 2023-03-19: qty 1

## 2023-03-19 NOTE — Plan of Care (Signed)

## 2023-03-19 NOTE — Progress Notes (Signed)
 PROGRESS NOTE        PATIENT DETAILS Name: Lisa Zamora Age: 68 y.o. Sex: female Date of Birth: 03/20/1955 Admit Date: 03/16/2023 Admitting Physician Marsa KATHEE Scurry, MD ERE:Rojmx, Lacinda Browning, MD  Brief Summary: Patient is a 68 y.o.  female with history of COPD, HTN, DM-2, PAD-who presented with worsening shortness of breath-she was found to have acute COPD exacerbation in the setting of influenza infection.  Significant events: 2/1>> admit to [facility]-COPD exacerbation-hypoxic-requiring BiPAP-influenza A+. 2/3>> liberated off BiPAP in the afternoon  Significant studies: 2/1>> CXR: No PNA  Significant microbiology data: 2/1>> influenza A: Positive 2/1>> COVID/influenza B/RSV PCR: Negative  Procedures: None  Consults: None  Subjective: Feels much better-less anxious-no BiPAP use since yesterday afternoon.  On 4 L of oxygen.  Objective: Vitals: Blood pressure (!) 159/73, pulse (!) 102, temperature 98.6 F (37 C), temperature source Oral, resp. rate 15, height 5' 3 (1.6 m), weight 61.7 kg, SpO2 94%.   Exam: Gen Exam:Alert awake-not in any distress HEENT:atraumatic, normocephalic Chest: B/L clear to auscultation anteriorly-some scattered wheezing CVS:S1S2 regular Abdomen:soft non tender, non distended Extremities:no edema Neurology: Non focal Skin: no rash  Pertinent Labs/Radiology:    Latest Ref Rng & Units 03/19/2023    4:36 AM 03/18/2023    4:53 AM 03/17/2023    1:13 PM  CBC  WBC 4.0 - 10.5 K/uL 7.9  12.7  13.0   Hemoglobin 12.0 - 15.0 g/dL 86.6  87.4  87.1   Hematocrit 36.0 - 46.0 % 41.7  38.7  39.1   Platelets 150 - 400 K/uL 277  282  263     Lab Results  Component Value Date   NA 137 03/19/2023   K 4.3 03/19/2023   CL 94 (L) 03/19/2023   CO2 31 03/19/2023      Assessment/Plan: Acute hypoxic respiratory failure due to COPD exacerbation from influenza A infection Much better-liberated off BiPAP 2/3 afternoon Stable on 4 L  of oxygen this morning-titrated down to 2 L Continue IV Solu-Medrol  for 1 more day Continue scheduled bronchodilators Continue Tamiflu  As needed Xanax  for anxiety Mobilize/out of bed to chair-pulmonary toileting with incentive spirometry/flutter valve.    HTN Better controlled-initially required IV antihypertensives as patient was on BiPAP Stop all IV antihypertensives-will transition to oral amlodipine /losartan /metoprolol  and follow BP trend.   DM-2 (A1c 7.2 on 2/2) CBG stable-continue SSI Resume oral hypoglycemic agents on discharge.  Recent Labs    03/18/23 2344 03/19/23 0413 03/19/23 0746  GLUCAP 158* 170* 168*    PAD Stable ASA/Plavix /Zetia   GERD PPI   BMI: Estimated body mass index is 24.09 kg/m as calculated from the following:   Height as of this encounter: 5' 3 (1.6 m).   Weight as of this encounter: 61.7 kg.   Code status:   Code Status: Full Code   DVT Prophylaxis: enoxaparin  (LOVENOX ) injection 40 mg Start: 03/17/23 1345   Family Communication: Daughter-[name]-[phone]-left voicemail on 2/4  Disposition Plan: Status is: Inpatient Remains inpatient appropriate because: Severity of illness   Planned Discharge Destination:Home health   Diet: Diet Order             Diet Heart Room service appropriate? Yes; Fluid consistency: Thin  Diet effective now                     Antimicrobial agents: Anti-infectives (From  admission, onward)    Start     Dose/Rate Route Frequency Ordered Stop   03/19/23 1000  oseltamivir  (TAMIFLU ) capsule 30 mg       Placed in Followed by Linked Group   30 mg Oral 2 times daily 03/18/23 1704 03/24/23 0959   03/18/23 1800  oseltamivir  (TAMIFLU ) capsule 75 mg       Placed in Followed by Linked Group   75 mg Oral  Once 03/18/23 1704 03/18/23 1750   03/18/23 1000  oseltamivir  (TAMIFLU ) capsule 30 mg  Status:  Discontinued       Placed in Followed by Linked Group   30 mg Oral 2 times daily 03/17/23  1520 03/18/23 1706   03/17/23 2200  oseltamivir  (TAMIFLU ) capsule 75 mg  Status:  Discontinued       Placed in Followed by Linked Group   75 mg Oral  Once 03/17/23 1520 03/18/23 1706        MEDICATIONS: Scheduled Meds:  amLODipine   10 mg Oral Daily   arformoterol   15 mcg Nebulization BID   aspirin  EC  81 mg Oral Daily   budesonide  (PULMICORT ) nebulizer solution  0.25 mg Nebulization BID   clopidogrel   75 mg Oral Daily   enoxaparin  (LOVENOX ) injection  40 mg Subcutaneous Q24H   ezetimibe   10 mg Oral Daily   insulin  aspart  0-9 Units Subcutaneous TID WC   ipratropium-albuterol   3 mL Nebulization Q6H   losartan   100 mg Oral Daily   methylPREDNISolone  (SOLU-MEDROL ) injection  60 mg Intravenous Q12H   metoprolol  succinate  50 mg Oral Daily   montelukast   10 mg Oral QHS   oseltamivir   30 mg Oral BID   pantoprazole   40 mg Oral Daily   revefenacin   175 mcg Nebulization Daily   sodium chloride  flush  3 mL Intravenous Q12H   Continuous Infusions:   PRN Meds:.albuterol , ALPRAZolam , hydrALAZINE , labetalol , metoprolol  tartrate, polyethylene glycol   I have personally reviewed following labs and imaging studies  LABORATORY DATA: CBC: Recent Labs  Lab 03/16/23 1549 03/16/23 1600 03/17/23 1313 03/18/23 0453 03/19/23 0436  WBC 13.1*  --  13.0* 12.7* 7.9  NEUTROABS 11.8*  --   --   --   --   HGB 13.2 14.3 12.8 12.5 13.3  HCT 39.2 42.0 39.1 38.7 41.7  MCV 87.7  --  90.3 92.1 92.5  PLT 268  --  263 282 277    Basic Metabolic Panel: Recent Labs  Lab 03/16/23 1549 03/16/23 1600 03/17/23 1313 03/18/23 0453 03/19/23 0436  NA 128* 132* 135 135 137  K 3.8 3.5 4.3 4.2 4.3  CL 95*  --  96* 99 94*  CO2 20*  --  29 28 31   GLUCOSE 224*  --  147* 126* 149*  BUN 16  --  14 18 19   CREATININE 0.90  --  1.01* 0.80 0.92  CALCIUM 9.8  --  9.4 9.1 9.3    GFR: Estimated Creatinine Clearance: 49.1 mL/min (by C-G formula based on SCr of 0.92 mg/dL).  Liver Function Tests: Recent  Labs  Lab 03/17/23 1313 03/18/23 0453  AST 41 50*  ALT 31 47*  ALKPHOS 60 57  BILITOT 0.5 0.5  PROT 6.6 6.5  ALBUMIN 3.3* 3.2*   No results for input(s): LIPASE, AMYLASE in the last 168 hours. No results for input(s): AMMONIA in the last 168 hours.  Coagulation Profile: No results for input(s): INR, PROTIME in the last 168 hours.  Cardiac Enzymes: No  results for input(s): CKTOTAL, CKMB, CKMBINDEX, TROPONINI in the last 168 hours.  BNP (last 3 results) No results for input(s): PROBNP in the last 8760 hours.  Lipid Profile: No results for input(s): CHOL, HDL, LDLCALC, TRIG, CHOLHDL, LDLDIRECT in the last 72 hours.  Thyroid Function Tests: No results for input(s): TSH, T4TOTAL, FREET4, T3FREE, THYROIDAB in the last 72 hours.  Anemia Panel: No results for input(s): VITAMINB12, FOLATE, FERRITIN, TIBC, IRON, RETICCTPCT in the last 72 hours.  Urine analysis:    Component Value Date/Time   COLORURINE YELLOW 03/16/2023 1806   APPEARANCEUR CLEAR 03/16/2023 1806   LABSPEC 1.020 03/16/2023 1806   PHURINE 5.5 03/16/2023 1806   GLUCOSEU 100 (A) 03/16/2023 1806   HGBUR NEGATIVE 03/16/2023 1806   BILIRUBINUR NEGATIVE 03/16/2023 1806   KETONESUR NEGATIVE 03/16/2023 1806   PROTEINUR NEGATIVE 03/16/2023 1806   UROBILINOGEN 0.2 12/18/2009 1207   NITRITE NEGATIVE 03/16/2023 1806   LEUKOCYTESUR SMALL (A) 03/16/2023 1806    Sepsis Labs: Lactic Acid, Venous    Component Value Date/Time   LATICACIDVEN 1.76 11/14/2017 1601    MICROBIOLOGY: Recent Results (from the past 240 hours)  Resp panel by RT-PCR (RSV, Flu A&B, Covid) Anterior Nasal Swab     Status: Abnormal   Collection Time: 03/16/23  3:48 PM   Specimen: Anterior Nasal Swab  Result Value Ref Range Status   SARS Coronavirus 2 by RT PCR NEGATIVE NEGATIVE Final    Comment: (NOTE) SARS-CoV-2 target nucleic acids are NOT DETECTED.  The SARS-CoV-2 RNA is generally  detectable in upper respiratory specimens during the acute phase of infection. The lowest concentration of SARS-CoV-2 viral copies this assay can detect is 138 copies/mL. A negative result does not preclude SARS-Cov-2 infection and should not be used as the sole basis for treatment or other patient management decisions. A negative result may occur with  improper specimen collection/handling, submission of specimen other than nasopharyngeal swab, presence of viral mutation(s) within the areas targeted by this assay, and inadequate number of viral copies(<138 copies/mL). A negative result must be combined with clinical observations, patient history, and epidemiological information. The expected result is Negative.  Fact Sheet for Patients:  bloggercourse.com  Fact Sheet for Healthcare Providers:  seriousbroker.it  This test is no t yet approved or cleared by the [place]  FDA and  has been authorized for detection and/or diagnosis of SARS-CoV-2 by FDA under an Emergency Use Authorization (EUA). This EUA will remain  in effect (meaning this test can be used) for the duration of the COVID-19 declaration under Section 564(b)(1) of the Act, 21 U.S.C.section 360bbb-3(b)(1), unless the authorization is terminated  or revoked sooner.       Influenza A by PCR POSITIVE (A) NEGATIVE Final   Influenza B by PCR NEGATIVE NEGATIVE Final    Comment: (NOTE) The Xpert Xpress SARS-CoV-2/FLU/RSV plus assay is intended as an aid in the diagnosis of influenza from Nasopharyngeal swab specimens and should not be used as a sole basis for treatment. Nasal washings and aspirates are unacceptable for Xpert Xpress SARS-CoV-2/FLU/RSV testing.  Fact Sheet for Patients: bloggercourse.com  Fact Sheet for Healthcare Providers: seriousbroker.it  This test is not yet approved or cleared by the [place]   FDA and has been authorized for detection and/or diagnosis of SARS-CoV-2 by FDA under an Emergency Use Authorization (EUA). This EUA will remain in effect (meaning this test can be used) for the duration of the COVID-19 declaration under Section 564(b)(1) of the Act, 21 U.S.C. section 360bbb-3(b)(1), unless the  authorization is terminated or revoked.     Resp Syncytial Virus by PCR NEGATIVE NEGATIVE Final    Comment: (NOTE) Fact Sheet for Patients: bloggercourse.com  Fact Sheet for Healthcare Providers: seriousbroker.it  This test is not yet approved or cleared by the [place]  FDA and has been authorized for detection and/or diagnosis of SARS-CoV-2 by FDA under an Emergency Use Authorization (EUA). This EUA will remain in effect (meaning this test can be used) for the duration of the COVID-19 declaration under Section 564(b)(1) of the Act, 21 U.S.C. section 360bbb-3(b)(1), unless the authorization is terminated or revoked.  Performed at [facility], [address] [place], [place] [address]   Respiratory (~20 pathogens) panel by PCR     Status: Abnormal   Collection Time: 03/18/23  3:15 PM   Specimen: Nasopharyngeal Swab; Respiratory  Result Value Ref Range Status   Adenovirus NOT DETECTED NOT DETECTED Final   Coronavirus 229E NOT DETECTED NOT DETECTED Final    Comment: (NOTE) The Coronavirus on the Respiratory Panel, DOES NOT test for the novel  Coronavirus (2019 nCoV)    Coronavirus HKU1 NOT DETECTED NOT DETECTED Final   Coronavirus NL63 NOT DETECTED NOT DETECTED Final   Coronavirus OC43 NOT DETECTED NOT DETECTED Final   Metapneumovirus NOT DETECTED NOT DETECTED Final   Rhinovirus / Enterovirus NOT DETECTED NOT DETECTED Final   Influenza A H1 2009 DETECTED (A) NOT DETECTED Final   Influenza B NOT DETECTED NOT DETECTED Final   Parainfluenza Virus 1 NOT DETECTED NOT DETECTED Final   Parainfluenza Virus 2  NOT DETECTED NOT DETECTED Final   Parainfluenza Virus 3 NOT DETECTED NOT DETECTED Final   Parainfluenza Virus 4 NOT DETECTED NOT DETECTED Final   Respiratory Syncytial Virus NOT DETECTED NOT DETECTED Final   Bordetella pertussis NOT DETECTED NOT DETECTED Final   Bordetella Parapertussis NOT DETECTED NOT DETECTED Final   Chlamydophila pneumoniae NOT DETECTED NOT DETECTED Final   Mycoplasma pneumoniae NOT DETECTED NOT DETECTED Final    Comment: Performed at [facility], [address], [place], [place] [address]    RADIOLOGY STUDIES/RESULTS: DG Chest Port 1V same Day Result Date: 03/18/2023 CLINICAL DATA:  Shortness of breath EXAM: PORTABLE CHEST 1 VIEW COMPARISON:  03/16/2023 FINDINGS: There is hyperinflation of the lungs compatible with COPD. Heart and mediastinal contours within normal limits. Aortic atherosclerosis. No confluent opacities or effusions. IMPRESSION: Hyperinflation/COPD.  No active disease. Electronically Signed   By: Franky Crease M.D.   On: 03/18/2023 10:48     LOS: 2 days   Donalda Applebaum, MD  [facility]    To contact the attending provider between 7A-7P or the covering provider during after hours 7P-7A, please log into the web site [email or web address] and access using universal [employer] password for that web site. If you do not have the password, please call the hospital operator.  03/19/2023, 11:09 AM

## 2023-03-19 NOTE — Plan of Care (Signed)
  Problem: Health Behavior/Discharge Planning: Goal: Ability to manage health-related needs will improve Outcome: Progressing   Problem: Clinical Measurements: Goal: Ability to maintain clinical measurements within normal limits will improve Outcome: Progressing Goal: Will remain free from infection Outcome: Progressing Goal: Respiratory complications will improve Outcome: Progressing   Problem: Activity: Goal: Risk for activity intolerance will decrease Outcome: Progressing   Problem: Education: Goal: Knowledge of disease or condition will improve Outcome: Progressing

## 2023-03-19 NOTE — Progress Notes (Signed)
  Echocardiogram 2D Echocardiogram has been performed.  Lisa Zamora 03/19/2023, 10:52 AM

## 2023-03-20 DIAGNOSIS — J441 Chronic obstructive pulmonary disease with (acute) exacerbation: Secondary | ICD-10-CM | POA: Diagnosis not present

## 2023-03-20 DIAGNOSIS — E119 Type 2 diabetes mellitus without complications: Secondary | ICD-10-CM | POA: Diagnosis not present

## 2023-03-20 DIAGNOSIS — I10 Essential (primary) hypertension: Secondary | ICD-10-CM | POA: Diagnosis not present

## 2023-03-20 DIAGNOSIS — J9601 Acute respiratory failure with hypoxia: Secondary | ICD-10-CM | POA: Diagnosis not present

## 2023-03-20 LAB — GLUCOSE, CAPILLARY
Glucose-Capillary: 170 mg/dL — ABNORMAL HIGH (ref 70–99)
Glucose-Capillary: 210 mg/dL — ABNORMAL HIGH (ref 70–99)
Glucose-Capillary: 270 mg/dL — ABNORMAL HIGH (ref 70–99)
Glucose-Capillary: 322 mg/dL — ABNORMAL HIGH (ref 70–99)

## 2023-03-20 MED ORDER — INSULIN ASPART 100 UNIT/ML IJ SOLN
0.0000 [IU] | Freq: Three times a day (TID) | INTRAMUSCULAR | Status: DC
Start: 2023-03-20 — End: 2023-03-20

## 2023-03-20 MED ORDER — BENZONATATE 100 MG PO CAPS
200.0000 mg | ORAL_CAPSULE | Freq: Three times a day (TID) | ORAL | Status: DC | PRN
  Administered 2023-03-20: 200 mg via ORAL
  Filled 2023-03-20: qty 2

## 2023-03-20 MED ORDER — CHLORTHALIDONE 25 MG PO TABS
25.0000 mg | ORAL_TABLET | Freq: Every day | ORAL | Status: DC
  Administered 2023-03-20 – 2023-03-21 (×2): 25 mg via ORAL
  Filled 2023-03-20 (×2): qty 1

## 2023-03-20 MED ORDER — IPRATROPIUM-ALBUTEROL 0.5-2.5 (3) MG/3ML IN SOLN
3.0000 mL | Freq: Three times a day (TID) | RESPIRATORY_TRACT | Status: DC
Start: 2023-03-20 — End: 2023-03-21
  Administered 2023-03-20 – 2023-03-21 (×4): 3 mL via RESPIRATORY_TRACT
  Filled 2023-03-20 (×4): qty 3

## 2023-03-20 MED ORDER — GUAIFENESIN ER 600 MG PO TB12
600.0000 mg | ORAL_TABLET | Freq: Two times a day (BID) | ORAL | Status: DC
  Administered 2023-03-20 – 2023-03-21 (×3): 600 mg via ORAL
  Filled 2023-03-20 (×3): qty 1

## 2023-03-20 MED ORDER — INSULIN ASPART 100 UNIT/ML IJ SOLN
0.0000 [IU] | Freq: Three times a day (TID) | INTRAMUSCULAR | Status: DC
  Administered 2023-03-20: 7 [IU] via SUBCUTANEOUS
  Administered 2023-03-20: 11 [IU] via SUBCUTANEOUS
  Administered 2023-03-21: 4 [IU] via SUBCUTANEOUS

## 2023-03-20 MED ORDER — PREDNISONE 20 MG PO TABS
40.0000 mg | ORAL_TABLET | Freq: Every day | ORAL | Status: DC
  Administered 2023-03-20 – 2023-03-21 (×2): 40 mg via ORAL
  Filled 2023-03-20 (×2): qty 2

## 2023-03-20 NOTE — Plan of Care (Signed)

## 2023-03-20 NOTE — Progress Notes (Signed)
   03/20/23 2346  BiPAP/CPAP/SIPAP  $ Non-Invasive Home Ventilator  Initial  BiPAP/CPAP/SIPAP Pt Type Adult  BiPAP/CPAP/SIPAP Resmed  Mask Type Nasal mask  Mask Size Medium  EPAP 8 cmH2O  Flow Rate 4 lpm  Patient Home Equipment No  Auto Titrate No  CPAP/SIPAP surface wiped down Yes  BiPAP/CPAP /SiPAP Vitals  Pulse Rate 90  Resp (!) 23  SpO2 98 %  MEWS Score/Color  MEWS Score 1  MEWS Score Color Green

## 2023-03-20 NOTE — Progress Notes (Signed)
 PROGRESS NOTE        PATIENT DETAILS Name: Lisa Zamora Age: 68 y.o. Sex: female Date of Birth: 16-Jan-1956 Admit Date: 03/16/2023 Admitting Physician Marsa KATHEE Scurry, MD ERE:Rojmx, Lacinda Browning, MD  Brief Summary: Patient is a 68 y.o.  female with history of COPD, HTN, DM-2, PAD-who presented with worsening shortness of breath-she was found to have acute COPD exacerbation in the setting of influenza infection.  Significant events: 2/1>> admit to [facility]-COPD exacerbation-hypoxic-requiring BiPAP-influenza A+. 2/3>> liberated off BiPAP in the afternoon  Significant studies: 2/1>> CXR: No PNA  Significant microbiology data: 2/1>> influenza A: Positive 2/1>> COVID/influenza B/RSV PCR: Negative  Procedures: None  Consults: None  Subjective: Much better-less shortness of breath-some minimal wheezing-down to 2 L of oxygen.  Objective: Vitals: Blood pressure (!) 168/77, pulse 74, temperature 97.9 F (36.6 C), temperature source Oral, resp. rate 20, height 5' 3 (1.6 m), weight 61.7 kg, SpO2 96%.   Exam: Gen Exam:Alert awake-not in any distress HEENT:atraumatic, normocephalic Chest: B/L clear to auscultation anteriorly-scattered rhonchi CVS:S1S2 regular Abdomen:soft non tender, non distended Extremities:no edema Neurology: Non focal Skin: no rash  Pertinent Labs/Radiology:    Latest Ref Rng & Units 03/19/2023    4:36 AM 03/18/2023    4:53 AM 03/17/2023    1:13 PM  CBC  WBC 4.0 - 10.5 K/uL 7.9  12.7  13.0   Hemoglobin 12.0 - 15.0 g/dL 86.6  87.4  87.1   Hematocrit 36.0 - 46.0 % 41.7  38.7  39.1   Platelets 150 - 400 K/uL 277  282  263     Lab Results  Component Value Date   NA 137 03/19/2023   K 4.3 03/19/2023   CL 94 (L) 03/19/2023   CO2 31 03/19/2023      Assessment/Plan: Acute hypoxic respiratory failure due to COPD exacerbation from influenza A infection Much better-on 2 L of oxygen-will attempt to titrate down to room air  today Transition to prednisone  Continue scheduled bronchodilators Tamiflu  Continue to mobilize as much as possible.   HTN BP better-continue amlodipine /losartan /metoprolol -restart chlorthalidone . Follow/optimize.    DM-2 (A1c 7.2 on 2/2) with uncontrolled hyperglycemia due to steroids CBGs on the higher side-will switch to resistant SSI-suspect that his steroid dosage gets titrated down-CBGs will improve Resume oral hypoglycemic agents on discharge.  Recent Labs    03/19/23 1545 03/19/23 2115 03/20/23 0750  GLUCAP 255* 256* 170*    PAD Stable ASA/Plavix /Zetia   GERD PPI   BMI: Estimated body mass index is 24.09 kg/m as calculated from the following:   Height as of this encounter: 5' 3 (1.6 m).   Weight as of this encounter: 61.7 kg.   Code status:   Code Status: Full Code   DVT Prophylaxis: enoxaparin  (LOVENOX ) injection 40 mg Start: 03/17/23 1345   Family Communication: Daughter-[name]-[phone]-left voicemail on 2/4  Disposition Plan: Status is: Inpatient Remains inpatient appropriate because: Severity of illness   Planned Discharge Destination:Home health   Diet: Diet Order             Diet Heart Room service appropriate? Yes; Fluid consistency: Thin  Diet effective now                     Antimicrobial agents: Anti-infectives (From admission, onward)    Start     Dose/Rate Route Frequency Ordered Stop   03/19/23  1000  oseltamivir  (TAMIFLU ) capsule 30 mg       Placed in Followed by Linked Group   30 mg Oral 2 times daily 03/18/23 1704 03/24/23 0959   03/18/23 1800  oseltamivir  (TAMIFLU ) capsule 75 mg       Placed in Followed by Linked Group   75 mg Oral  Once 03/18/23 1704 03/18/23 1750   03/18/23 1000  oseltamivir  (TAMIFLU ) capsule 30 mg  Status:  Discontinued       Placed in Followed by Linked Group   30 mg Oral 2 times daily 03/17/23 1520 03/18/23 1706   03/17/23 2200  oseltamivir  (TAMIFLU ) capsule 75 mg  Status:   Discontinued       Placed in Followed by Linked Group   75 mg Oral  Once 03/17/23 1520 03/18/23 1706        MEDICATIONS: Scheduled Meds:  amLODipine   10 mg Oral Daily   arformoterol   15 mcg Nebulization BID   aspirin  EC  81 mg Oral Daily   budesonide  (PULMICORT ) nebulizer solution  0.25 mg Nebulization BID   chlorthalidone   25 mg Oral Daily   clopidogrel   75 mg Oral Daily   enoxaparin  (LOVENOX ) injection  40 mg Subcutaneous Q24H   ezetimibe   10 mg Oral Daily   guaiFENesin   600 mg Oral BID   insulin  aspart  0-15 Units Subcutaneous TID WC   ipratropium-albuterol   3 mL Nebulization TID   losartan   100 mg Oral Daily   metoprolol  succinate  50 mg Oral Daily   montelukast   10 mg Oral QHS   oseltamivir   30 mg Oral BID   pantoprazole   40 mg Oral Daily   predniSONE   40 mg Oral Q breakfast   revefenacin   175 mcg Nebulization Daily   sodium chloride  flush  3 mL Intravenous Q12H   Continuous Infusions:   PRN Meds:.albuterol , ALPRAZolam , benzonatate , hydrALAZINE , labetalol , metoprolol  tartrate, polyethylene glycol   I have personally reviewed following labs and imaging studies  LABORATORY DATA: CBC: Recent Labs  Lab 03/16/23 1549 03/16/23 1600 03/17/23 1313 03/18/23 0453 03/19/23 0436  WBC 13.1*  --  13.0* 12.7* 7.9  NEUTROABS 11.8*  --   --   --   --   HGB 13.2 14.3 12.8 12.5 13.3  HCT 39.2 42.0 39.1 38.7 41.7  MCV 87.7  --  90.3 92.1 92.5  PLT 268  --  263 282 277    Basic Metabolic Panel: Recent Labs  Lab 03/16/23 1549 03/16/23 1600 03/17/23 1313 03/18/23 0453 03/19/23 0436  NA 128* 132* 135 135 137  K 3.8 3.5 4.3 4.2 4.3  CL 95*  --  96* 99 94*  CO2 20*  --  29 28 31   GLUCOSE 224*  --  147* 126* 149*  BUN 16  --  14 18 19   CREATININE 0.90  --  1.01* 0.80 0.92  CALCIUM 9.8  --  9.4 9.1 9.3    GFR: Estimated Creatinine Clearance: 49.1 mL/min (by C-G formula based on SCr of 0.92 mg/dL).  Liver Function Tests: Recent Labs  Lab 03/17/23 1313  03/18/23 0453  AST 41 50*  ALT 31 47*  ALKPHOS 60 57  BILITOT 0.5 0.5  PROT 6.6 6.5  ALBUMIN 3.3* 3.2*   No results for input(s): LIPASE, AMYLASE in the last 168 hours. No results for input(s): AMMONIA in the last 168 hours.  Coagulation Profile: No results for input(s): INR, PROTIME in the last 168 hours.  Cardiac Enzymes: No results for  input(s): CKTOTAL, CKMB, CKMBINDEX, TROPONINI in the last 168 hours.  BNP (last 3 results) No results for input(s): PROBNP in the last 8760 hours.  Lipid Profile: No results for input(s): CHOL, HDL, LDLCALC, TRIG, CHOLHDL, LDLDIRECT in the last 72 hours.  Thyroid Function Tests: No results for input(s): TSH, T4TOTAL, FREET4, T3FREE, THYROIDAB in the last 72 hours.  Anemia Panel: No results for input(s): VITAMINB12, FOLATE, FERRITIN, TIBC, IRON, RETICCTPCT in the last 72 hours.  Urine analysis:    Component Value Date/Time   COLORURINE YELLOW 03/16/2023 1806   APPEARANCEUR CLEAR 03/16/2023 1806   LABSPEC 1.020 03/16/2023 1806   PHURINE 5.5 03/16/2023 1806   GLUCOSEU 100 (A) 03/16/2023 1806   HGBUR NEGATIVE 03/16/2023 1806   BILIRUBINUR NEGATIVE 03/16/2023 1806   KETONESUR NEGATIVE 03/16/2023 1806   PROTEINUR NEGATIVE 03/16/2023 1806   UROBILINOGEN 0.2 12/18/2009 1207   NITRITE NEGATIVE 03/16/2023 1806   LEUKOCYTESUR SMALL (A) 03/16/2023 1806    Sepsis Labs: Lactic Acid, Venous    Component Value Date/Time   LATICACIDVEN 1.76 11/14/2017 1601    MICROBIOLOGY: Recent Results (from the past 240 hours)  Resp panel by RT-PCR (RSV, Flu A&B, Covid) Anterior Nasal Swab     Status: Abnormal   Collection Time: 03/16/23  3:48 PM   Specimen: Anterior Nasal Swab  Result Value Ref Range Status   SARS Coronavirus 2 by RT PCR NEGATIVE NEGATIVE Final    Comment: (NOTE) SARS-CoV-2 target nucleic acids are NOT DETECTED.  The SARS-CoV-2 RNA is generally detectable in upper  respiratory specimens during the acute phase of infection. The lowest concentration of SARS-CoV-2 viral copies this assay can detect is 138 copies/mL. A negative result does not preclude SARS-Cov-2 infection and should not be used as the sole basis for treatment or other patient management decisions. A negative result may occur with  improper specimen collection/handling, submission of specimen other than nasopharyngeal swab, presence of viral mutation(s) within the areas targeted by this assay, and inadequate number of viral copies(<138 copies/mL). A negative result must be combined with clinical observations, patient history, and epidemiological information. The expected result is Negative.  Fact Sheet for Patients:  bloggercourse.com  Fact Sheet for Healthcare Providers:  seriousbroker.it  This test is no t yet approved or cleared by the [place]  FDA and  has been authorized for detection and/or diagnosis of SARS-CoV-2 by FDA under an Emergency Use Authorization (EUA). This EUA will remain  in effect (meaning this test can be used) for the duration of the COVID-19 declaration under Section 564(b)(1) of the Act, 21 U.S.C.section 360bbb-3(b)(1), unless the authorization is terminated  or revoked sooner.       Influenza A by PCR POSITIVE (A) NEGATIVE Final   Influenza B by PCR NEGATIVE NEGATIVE Final    Comment: (NOTE) The Xpert Xpress SARS-CoV-2/FLU/RSV plus assay is intended as an aid in the diagnosis of influenza from Nasopharyngeal swab specimens and should not be used as a sole basis for treatment. Nasal washings and aspirates are unacceptable for Xpert Xpress SARS-CoV-2/FLU/RSV testing.  Fact Sheet for Patients: bloggercourse.com  Fact Sheet for Healthcare Providers: seriousbroker.it  This test is not yet approved or cleared by the [place]  FDA and has been  authorized for detection and/or diagnosis of SARS-CoV-2 by FDA under an Emergency Use Authorization (EUA). This EUA will remain in effect (meaning this test can be used) for the duration of the COVID-19 declaration under Section 564(b)(1) of the Act, 21 U.S.C. section 360bbb-3(b)(1), unless the authorization is  terminated or revoked.     Resp Syncytial Virus by PCR NEGATIVE NEGATIVE Final    Comment: (NOTE) Fact Sheet for Patients: bloggercourse.com  Fact Sheet for Healthcare Providers: seriousbroker.it  This test is not yet approved or cleared by the [place]  FDA and has been authorized for detection and/or diagnosis of SARS-CoV-2 by FDA under an Emergency Use Authorization (EUA). This EUA will remain in effect (meaning this test can be used) for the duration of the COVID-19 declaration under Section 564(b)(1) of the Act, 21 U.S.C. section 360bbb-3(b)(1), unless the authorization is terminated or revoked.  Performed at [facility], [address] [place], [place] [address]   Respiratory (~20 pathogens) panel by PCR     Status: Abnormal   Collection Time: 03/18/23  3:15 PM   Specimen: Nasopharyngeal Swab; Respiratory  Result Value Ref Range Status   Adenovirus NOT DETECTED NOT DETECTED Final   Coronavirus 229E NOT DETECTED NOT DETECTED Final    Comment: (NOTE) The Coronavirus on the Respiratory Panel, DOES NOT test for the novel  Coronavirus (2019 nCoV)    Coronavirus HKU1 NOT DETECTED NOT DETECTED Final   Coronavirus NL63 NOT DETECTED NOT DETECTED Final   Coronavirus OC43 NOT DETECTED NOT DETECTED Final   Metapneumovirus NOT DETECTED NOT DETECTED Final   Rhinovirus / Enterovirus NOT DETECTED NOT DETECTED Final   Influenza A H1 2009 DETECTED (A) NOT DETECTED Final   Influenza B NOT DETECTED NOT DETECTED Final   Parainfluenza Virus 1 NOT DETECTED NOT DETECTED Final   Parainfluenza Virus 2 NOT DETECTED NOT  DETECTED Final   Parainfluenza Virus 3 NOT DETECTED NOT DETECTED Final   Parainfluenza Virus 4 NOT DETECTED NOT DETECTED Final   Respiratory Syncytial Virus NOT DETECTED NOT DETECTED Final   Bordetella pertussis NOT DETECTED NOT DETECTED Final   Bordetella Parapertussis NOT DETECTED NOT DETECTED Final   Chlamydophila pneumoniae NOT DETECTED NOT DETECTED Final   Mycoplasma pneumoniae NOT DETECTED NOT DETECTED Final    Comment: Performed at [facility], [address], [place], [place] [address]    RADIOLOGY STUDIES/RESULTS: ECHOCARDIOGRAM COMPLETE Result Date: 03/19/2023    ECHOCARDIOGRAM REPORT   Patient Name:   ALMEDIA CORDELL Date of Exam: 03/19/2023 Medical Rec #:  [id number]    Height:       63.0 in Accession #:    [id number]   Weight:       136.0 lb Date of Birth:  November 23, 1955     BSA:          1.641 m Patient Age:    67 years     BP:           159/73 mmHg Patient Gender: F            HR:           95 bpm. Exam Location:  Inpatient Procedure: 2D Echo, Cardiac Doppler and Color Doppler Indications:    R06.02 SOB  History:        Patient has no prior history of Echocardiogram examinations.                 COPD, Signs/Symptoms:Shortness of Breath, Dyspnea and Altered                 Mental Status; Risk Factors:Hypertension, Diabetes, Sleep Apnea                 and Dyslipidemia. FLU positive.  Sonographer:    Ellouise Mose RDCS Referring  Phys: [id number] [facility]  Sonographer Comments: Suboptimal parasternal window. Patient coughing throughout exam, high fowler's position due to dyspnea. IMPRESSIONS  1. There is turbulent flow in the LV outflow tract due to the hyperdynamic state, without true obstruction. Left ventricular ejection fraction, by estimation, is 70 to 75%. The left ventricle has hyperdynamic function. The left ventricle has no regional  wall motion abnormalities. Left ventricular diastolic parameters are consistent with Grade II diastolic dysfunction (pseudonormalization). Elevated left  atrial pressure.  2. Right ventricular systolic function is hyperdynamic. The right ventricular size is normal.  3. Left atrial size was mildly dilated.  4. Right atrial size was mildly dilated.  5. Splay artifact perhaps underestimates mitral regurgitation severity. . The mitral valve is degenerative. Mild to moderate mitral valve regurgitation. Moderate mitral annular calcification.  6. The aortic valve is grossly normal. There is mild calcification of the aortic valve. There is mild thickening of the aortic valve. Aortic valve regurgitation is not visualized. Aortic valve sclerosis/calcification is present, without any evidence of aortic stenosis.  7. The inferior vena cava is normal in size with greater than 50% respiratory variability, suggesting right atrial pressure of 3 mmHg. Comparison(s): No prior Echocardiogram. FINDINGS  Left Ventricle: There is turbulent flow in the LV outflow tract due to the hyperdynamic state, without true obstruction. Left ventricular ejection fraction, by estimation, is 70 to 75%. The left ventricle has hyperdynamic function. The left ventricle has no regional wall motion abnormalities. The left ventricular internal cavity size was small. There is no left ventricular hypertrophy. Left ventricular diastolic parameters are consistent with Grade II diastolic dysfunction (pseudonormalization). Elevated left atrial pressure. Right Ventricle: The right ventricular size is normal. No increase in right ventricular wall thickness. Right ventricular systolic function is hyperdynamic. Left Atrium: Left atrial size was mildly dilated. Right Atrium: Right atrial size was mildly dilated. Pericardium: There is no evidence of pericardial effusion. Mitral Valve: Splay artifact perhaps underestimates mitral regurgitation severity. The mitral valve is degenerative in appearance. Moderate mitral annular calcification. Mild to moderate mitral valve regurgitation. Tricuspid Valve: The tricuspid valve is  normal in structure. Tricuspid valve regurgitation is not demonstrated. No evidence of tricuspid stenosis. Aortic Valve: The aortic valve is grossly normal. There is mild calcification of the aortic valve. There is mild thickening of the aortic valve. Aortic valve regurgitation is not visualized. Aortic valve sclerosis/calcification is present, without any evidence of aortic stenosis. Pulmonic Valve: The pulmonic valve was normal in structure. Pulmonic valve regurgitation is not visualized. No evidence of pulmonic stenosis. Aorta: The aortic root and ascending aorta are structurally normal, with no evidence of dilitation. Venous: The inferior vena cava is normal in size with greater than 50% respiratory variability, suggesting right atrial pressure of 3 mmHg. IAS/Shunts: No atrial level shunt detected by color flow Doppler.  LEFT VENTRICLE PLAX 2D LVIDd:         4.90 cm     Diastology LVIDs:         2.80 cm     LV e' medial:    6.85 cm/s LV PW:         1.00 cm     LV E/e' medial:  18.7 LV IVS:        0.90 cm     LV e' lateral:   7.07 cm/s LVOT diam:     2.00 cm     LV E/e' lateral: 18.1 LV SV:         72 LV SV Index:  44 LVOT Area:     3.14 cm  LV Volumes (MOD) LV vol d, MOD A2C: 41.4 ml LV vol d, MOD A4C: 30.7 ml LV vol s, MOD A2C: 11.5 ml LV vol s, MOD A4C: 9.8 ml LV SV MOD A2C:     29.9 ml LV SV MOD A4C:     30.7 ml LV SV MOD BP:      25.8 ml RIGHT VENTRICLE             IVC RV S prime:     13.40 cm/s  IVC diam: 2.10 cm TAPSE (M-mode): 2.6 cm LEFT ATRIUM             Index        RIGHT ATRIUM           Index LA diam:        3.60 cm 2.19 cm/m   RA Area:     10.30 cm LA Vol (A2C):   48.9 ml 29.79 ml/m  RA Volume:   20.10 ml  12.25 ml/m LA Vol (A4C):   37.0 ml 22.54 ml/m LA Biplane Vol: 42.8 ml 26.08 ml/m  AORTIC VALVE LVOT Vmax:   147.00 cm/s LVOT Vmean:  94.300 cm/s LVOT VTI:    0.228 m  AORTA Ao Root diam: 2.50 cm Ao Asc diam:  2.50 cm MITRAL VALVE MV Area (PHT): 5.52 cm     SHUNTS MV Decel Time: 138 msec      Systemic VTI:  0.23 m MV E velocity: 128.00 cm/s  Systemic Diam: 2.00 cm MV A velocity: 133.50 cm/s MV E/A ratio:  0.96 Mihai Croitoru MD Electronically signed by Jerel Balding MD Signature Date/Time: 03/19/2023/11:20:16 AM    Final      LOS: 3 days   Donalda Applebaum, MD  [facility]    To contact the attending provider between 7A-7P or the covering provider during after hours 7P-7A, please log into the web site [email or web address] and access using universal [place] password for that web site. If you do not have the password, please call the hospital operator.  03/20/2023, 10:47 AM

## 2023-03-20 NOTE — Inpatient Diabetes Management (Signed)
 Inpatient Diabetes Program Recommendations  AACE/ADA: New Consensus Statement on Inpatient Glycemic Control (2015)  Target Ranges:  Prepandial:   less than 140 mg/dL      Peak postprandial:   less than 180 mg/dL (1-2 hours)      Critically ill patients:  140 - 180 mg/dL   Lab Results  Component Value Date   GLUCAP 170 (H) 03/20/2023   HGBA1C 7.2 (H) 03/17/2023    Review of Glycemic Control  Latest Reference Range & Units 03/19/23 15:45 03/19/23 21:15 03/20/23 07:50  Glucose-Capillary 70 - 99 mg/dL 744 (H) 743 (H) 829 (H)   Diabetes history: DM  Outpatient Diabetes medications: Metformin XR 750 mg daily Current orders for Inpatient glycemic control:  Novolog  0-20 units tid with meals  Prednisone  40 mg daily  Inpatient Diabetes Program Recommendations:    Consider adding Novolog  3 units tid with meals (hold if patient eats less than 50% or NPO) while on Prednisone .   Thanks  Randall Bullocks, RN, BC-ADM Inpatient Diabetes Coordinator Pager [phone]  (8a-5p)

## 2023-03-20 NOTE — Progress Notes (Cosign Needed)
 The patient reports continued mild dyspnea with exertion, which seems to be improving gradually. She states that for the past few days, she has been experiencing an unproductive cough. However, as of today, she has begun to notice some tan, thin sputum being produced, which could suggest improvement or a change in her respiratory status.  The patient mentions she feels comfortable and has not expressed any new physiological or personal needs at this time. Diabetes mellitus type II is well managed with insulin  despite the absence of home medications. Hypertension is effectively controlled within the reference range.  Continue to wean pt off insulin  therapy and oxygen to return to normal baseline and prepare for discharge. Encourage the use of incentive spirometry and CPAP throughout the night.

## 2023-03-20 NOTE — Evaluation (Signed)
 Physical Therapy Brief Evaluation and Discharge Note Patient Details Name: Lisa Zamora MRN: [id number] DOB: August 20, 1955 Today's Date: 03/20/2023   History of Present Illness  Patient is a 68 y.o. female who presented with worsening shortness of breath-she was found to have acute COPD exacerbation in the setting of influenza infection. Past history of COPD, HTN, DM-2, PAD.  Clinical Impression  Pt presents with admitting diagnosis above. Pt today was able to ambulate in hallway at supervision level with no AD. Pt overall steady however SpO2 dropped to 86% on RA during ambulation however would quickly rise to 92% with PLB. Pt reports prior to admission she was fully independent with no AD. Pt presents at or near baseline mobility. Pt has no further acute PT needs and will be signing off. Re consult PT if mobility status changes. Pt would benefit from continued mobility with mobility specialist during acute stay.        PT Assessment Patient does not need any further PT services  Assistance Needed at Discharge  PRN    Equipment Recommendations None recommended by PT  Recommendations for Other Services       Precautions/Restrictions Precautions Precautions: Fall;Other (comment) Precaution Comments: droplet Restrictions Weight Bearing Restrictions Per Provider Order: No        Mobility  Bed Mobility       General bed mobility comments: Pt up in recliner  Transfers Overall transfer level: Needs assistance Equipment used: None Transfers: Sit to/from Stand Sit to Stand: Supervision           General transfer comment: no physical assist provided    Ambulation/Gait Ambulation/Gait assistance: Supervision Gait Distance (Feet): 250 Feet Assistive device: None Gait Pattern/deviations: Decreased stride length, Step-through pattern Gait Speed: Below normal General Gait Details: Pt overall steady howver occasionally reached for external support when fatigued.  Home Activity  Instructions    Stairs            Modified Rankin (Stroke Patients Only)        Balance Overall balance assessment: Mild deficits observed, not formally tested                        Pertinent Vitals/Pain PT - Brief Vital Signs All Vital Signs Stable: Yes Pain Assessment Pain Assessment: No/denies pain     Home Living Family/patient expects to be discharged to:: Private residence Living Arrangements: Children Available Help at Discharge: Family;Available PRN/intermittently Home Environment: Level entry   Home Equipment: Rollator (4 wheels);Cane - single point;BSC/3in1;Shower seat - built in;Grab bars - tub/shower;Hand held shower head;Transport chair        Prior Function Level of Independence: Independent      UE/LE Assessment   UE ROM/Strength/Tone/Coordination: WFL    LE ROM/Strength/Tone/Coordination: [facility]      Communication   Communication Communication: No apparent difficulties     Cognition Overall Cognitive Status: Appears within functional limits for tasks assessed/performed       General Comments General comments (skin integrity, edema, etc.): SpO2 dropped to 86% on RA during ambulation however would quickly rise to 92% with PLB.    Exercises     Assessment/Plan    PT Problem List         PT Visit Diagnosis Other abnormalities of gait and mobility (R26.89)    No Skilled PT Patient at baseline level of functioning   Co-evaluation                AMPAC 6 Clicks Help  needed turning from your back to your side while in a flat bed without using bedrails?: None Help needed moving from lying on your back to sitting on the side of a flat bed without using bedrails?: None Help needed moving to and from a bed to a chair (including a wheelchair)?: None Help needed standing up from a chair using your arms (e.g., wheelchair or bedside chair)?: A Little Help needed to walk in hospital room?: A Little Help needed climbing 3-5  steps with a railing? : A Little 6 Click Score: 21      End of Session Equipment Utilized During Treatment: Gait belt Activity Tolerance: Patient tolerated treatment well Patient left: in chair;with call bell/phone within reach Nurse Communication: Mobility status PT Visit Diagnosis: Other abnormalities of gait and mobility (R26.89)     Time: 9059-9040 PT Time Calculation (min) (ACUTE ONLY): 19 min  Charges:   PT Evaluation $PT Eval Low Complexity: 1 Low      Stirling Orton B, PT, DPT Acute Rehab Services [phone]   Herschell Virani  03/20/2023, 12:16 PM

## 2023-03-21 ENCOUNTER — Other Ambulatory Visit (HOSPITAL_COMMUNITY): Payer: Self-pay

## 2023-03-21 DIAGNOSIS — J441 Chronic obstructive pulmonary disease with (acute) exacerbation: Secondary | ICD-10-CM | POA: Diagnosis not present

## 2023-03-21 DIAGNOSIS — F32A Depression, unspecified: Secondary | ICD-10-CM | POA: Diagnosis not present

## 2023-03-21 DIAGNOSIS — I10 Essential (primary) hypertension: Secondary | ICD-10-CM | POA: Diagnosis not present

## 2023-03-21 DIAGNOSIS — J9601 Acute respiratory failure with hypoxia: Secondary | ICD-10-CM | POA: Diagnosis not present

## 2023-03-21 LAB — GLUCOSE, CAPILLARY: Glucose-Capillary: 151 mg/dL — ABNORMAL HIGH (ref 70–99)

## 2023-03-21 MED ORDER — PREDNISONE 10 MG PO TABS
ORAL_TABLET | ORAL | 0 refills | Status: AC
  Filled 2023-03-21: qty 10, 4d supply, fill #0

## 2023-03-21 MED ORDER — ALBUTEROL SULFATE HFA 108 (90 BASE) MCG/ACT IN AERS
1.0000 | INHALATION_SPRAY | RESPIRATORY_TRACT | 0 refills | Status: AC | PRN
  Filled 2023-03-21: qty 18, 30d supply, fill #0

## 2023-03-21 MED ORDER — ASPIRIN 81 MG PO TBEC: 81.0000 mg | DELAYED_RELEASE_TABLET | Freq: Every day | ORAL | Status: AC

## 2023-03-21 MED ORDER — OSELTAMIVIR PHOSPHATE 30 MG PO CAPS
30.0000 mg | ORAL_CAPSULE | Freq: Two times a day (BID) | ORAL | 0 refills | Status: AC
  Filled 2023-03-21 (×2): qty 8, 4d supply, fill #0

## 2023-03-21 NOTE — Progress Notes (Signed)
   03/21/23 0920  Mobility  Activity Ambulated independently in hallway  Level of Assistance Standby assist, set-up cues, supervision of patient - no hands on  Assistive Device None  Distance Ambulated (ft) 100 ft  Activity Response Tolerated fair  Mobility Referral Yes  Mobility visit 1 Mobility  Mobility Specialist Start Time (ACUTE ONLY) 0900  Mobility Specialist Stop Time (ACUTE ONLY) 0920  Mobility Specialist Time Calculation (min) (ACUTE ONLY) 20 min   Mobility Specialist: Progress Note  Pt agreeable to mobility session - received in chair. PT without nasal canula on RA. C/o feeling winded during ambulation, required x3 standing rest breaks. Returned to chair with all needs met - call bell within reach.  Nurse requested Mobility Specialist to perform oxygen saturation test with pt which includes removing pt from oxygen both at rest and while ambulating.  Below are the results from that testing.     Patient Saturations on Room Air at Rest = spO2 93%  Patient Saturations on Room Air while Ambulating = sp02 88% .  Rested and performed pursed lip breathing for 1 minute with sp02 at 93%.   At end of testing pt left in room on RA spO2 93%  Reported results to nurse via securechat.   Virgle Boards, BS Mobility Specialist Please contact via SecureChat or  Rehab office at [phone].

## 2023-03-21 NOTE — TOC Transition Note (Signed)
 Transition of Care [facility]) - Discharge Note   Patient Details  Name: Lisa Zamora MRN: [id number] Date of Birth: 1956/01/16  [facility] CM/SW Contact:  Andrez JULIANNA George, RN Phone Number: 03/21/2023, 10:30 AM   Clinical Narrative:     Pt is discharging home with self care. Per MD pt doesn't qualify for home oxygen. Pt has transportation home.   Final next level of care: Home/Self Care Barriers to Discharge: No Barriers Identified   Patient Goals and CMS Choice            Discharge Placement                       Discharge Plan and Services Additional resources added to the After Visit Summary for                                       Social Drivers of Health (SDOH) Interventions SDOH Screenings   Food Insecurity: Patient Unable To Answer (03/17/2023)  Housing: Patient Unable To Answer (03/17/2023)  Transportation Needs: Patient Unable To Answer (03/17/2023)  Utilities: Patient Unable To Answer (03/17/2023)  Social Connections: Patient Unable To Answer (03/17/2023)  Tobacco Use: High Risk (03/17/2023)     Readmission Risk Interventions     No data to display

## 2023-03-21 NOTE — Discharge Summary (Signed)
 PATIENT DETAILS Name: Lisa Zamora Age: 68 y.o. Sex: female Date of Birth: Oct 06, 1955 MRN: [id number]. Admitting Physician: Marsa KATHEE Scurry, MD ERE:Rojmx, Lacinda Browning, MD  Admit Date: 03/16/2023 Discharge date: 03/21/2023  Recommendations for Outpatient Follow-up:  Follow up with PCP in 1-2 weeks Please obtain CMP/CBC in one week  Admitted From:  Home  Disposition: Home   Discharge Condition: good  CODE STATUS:   Code Status: Full Code   Diet recommendation:  Diet Order             Diet - low sodium heart healthy           Diet Carb Modified           Diet Heart Room service appropriate? Yes; Fluid consistency: Thin  Diet effective now                    Brief Summary: Patient is a 68 y.o.  female with history of COPD, HTN, DM-2, PAD-who presented with worsening shortness of breath-she was found to have acute COPD exacerbation in the setting of influenza infection.   Significant events: 2/1>> admit to [facility]-COPD exacerbation-hypoxic-requiring BiPAP-influenza A+. 2/3>> liberated off BiPAP in the afternoon   Significant studies: 2/1>> CXR: No PNA   Significant microbiology data: 2/1>> influenza A: Positive 2/1>> COVID/influenza B/RSV PCR: Negative   Procedures: None   Consults: None  Brief Hospital Course: Acute hypoxic respiratory failure due to COPD exacerbation from influenza A infection Initially required BiPAP-rapidly improved with steroids/bronchodilators/Tamiflu -now on room air-clinically improved as well-feels much better than how she first presented Continue prednisone  for few more days Continue bronchodilators Complete Tamiflu  x 5 days total.    HTN BP fluctuating but controlled this morning Continue amlodipine /metoprolol /losartan  and chlorthalidone .    DM-2 (A1c 7.2 on 2/2) with uncontrolled hyperglycemia due to steroids CBG stable on SSI Resume oral  hypoglycemics on discharge.    PAD Stable Continue ASA/Plavix /Zetia     GERD PPI    BMI: Estimated body mass index is 24.09 kg/m as calculated from the following:   Height as of this encounter: 5' 3 (1.6 m).   Weight as of this encounter: 61.7 kg.  Discharge Diagnoses:  Principal Problem:   Acute respiratory failure with hypoxia (HCC) Active Problems:   Bilateral carotid artery stenosis   Depressive disorder   Impaired cognition   Mixed hyperlipidemia   Obstructive sleep apnea (adult) (pediatric)   PAD (peripheral artery disease) (HCC)   Essential hypertension   Type 2 diabetes mellitus without complication, without long-term current use of insulin  (HCC)   COPD with acute exacerbation (HCC)   Influenza A   Discharge Instructions:  Activity:  As tolerated   Discharge Instructions     Call MD for:  difficulty breathing, headache or visual disturbances   Complete by: As directed    Diet - low sodium heart healthy   Complete by: As directed    Diet Carb Modified   Complete by: As directed    Discharge instructions   Complete by: As directed    Follow with Primary MD  Gretta Lacinda Browning, MD in 1-2 weeks  Please get a complete blood count and chemistry panel checked by your Primary MD at your next visit, and again as instructed by your Primary MD.  Get Medicines reviewed and adjusted: Please take all your medications with you for your next visit with your Primary MD  Laboratory/radiological data: Please request your Primary MD to go over all hospital tests and  procedure/radiological results at the follow up, please ask your Primary MD to get all Hospital records sent to his/her office.  In some cases, they will be blood work, cultures and biopsy results pending at the time of your discharge. Please request that your primary care M.D. follows up on these results.  Also Note the following: If you experience worsening of your admission symptoms, develop shortness of breath, life threatening emergency, suicidal or homicidal thoughts you  must seek medical attention immediately by calling 911 or calling your MD immediately  if symptoms less severe.  You must read complete instructions/literature along with all the possible adverse reactions/side effects for all the Medicines you take and that have been prescribed to you. Take any new Medicines after you have completely understood and accpet all the possible adverse reactions/side effects.   Do not drive when taking Pain medications or sleeping medications (Benzodaizepines)  Do not take more than prescribed Pain, Sleep and Anxiety Medications. It is not advisable to combine anxiety,sleep and pain medications without talking with your primary care practitioner  Special Instructions: If you have smoked or chewed Tobacco  in the last 2 yrs please stop smoking, stop any regular Alcohol  and or any Recreational drug use.  Wear Seat belts while driving.  Please note: You were cared for by a hospitalist during your hospital stay. Once you are discharged, your primary care physician will handle any further medical issues. Please note that NO REFILLS for any discharge medications will be authorized once you are discharged, as it is imperative that you return to your primary care physician (or establish a relationship with a primary care physician if you do not have one) for your post hospital discharge needs so that they can reassess your need for medications and monitor your lab values.   Increase activity slowly   Complete by: As directed       Allergies as of 03/21/2023       Reactions   Hydrocodone  Other (See Comments)   Psychosis/hallucinations/sleep walking. Other Reaction(s): Altered mental status, Other (See Comments) Psychosis/hallucinations/sleep walking.   Psychosis/hallucinations/sleep walking.   Statins Other (See Comments)   Other Reaction(s): Myalgias, Myalgias (intolerance)   Ezetimibe  Other (See Comments)   -Gas        Medication List     STOP taking these  medications    HYDROCHLOROTHIAZIDE PO       TAKE these medications    albuterol  108 (90 Base) MCG/ACT inhaler Commonly known as: VENTOLIN  HFA Inhale 1 puff into the lungs every 4 (four) hours as needed for shortness of breath or wheezing.   amLODipine  10 MG tablet Commonly known as: NORVASC  Take 10 mg by mouth daily.   aspirin  EC 81 MG tablet Take 1 tablet (81 mg total) by mouth daily. Swallow whole. Start taking on: March 22, 2023 What changed:  medication strength how much to take additional instructions   Breztri Aerosphere 160-9-4.8 MCG/ACT Aero Generic drug: Budeson-Glycopyrrol-Formoterol  Inhale 1 puff into the lungs.   chlorthalidone  25 MG tablet Commonly known as: HYGROTON  Take 25 mg by mouth.   clopidogrel  75 MG tablet Commonly known as: PLAVIX  Take 75 mg by mouth daily.   ezetimibe  10 MG tablet Commonly known as: ZETIA  Take 10 mg by mouth daily.   fluticasone 50 MCG/ACT nasal spray Commonly known as: FLONASE Place 2 sprays into both nostrils daily.   losartan  100 MG tablet Commonly known as: COZAAR  Take 100 mg by mouth daily.   metFORMIN 750 MG  24 hr tablet Commonly known as: GLUCOPHAGE-XR Take 750 mg by mouth daily with breakfast.   metoprolol  succinate 50 MG 24 hr tablet Commonly known as: TOPROL -XL Take 50 mg by mouth daily. Take with or immediately following a meal.   montelukast  10 MG tablet Commonly known as: SINGULAIR  Take 10 mg by mouth at bedtime.   omeprazole 20 MG capsule Commonly known as: PRILOSEC Take 20 mg by mouth every morning.   oseltamivir  30 MG capsule Commonly known as: TAMIFLU  Take 1 capsule (30 mg total) by mouth 2 (two) times daily for 4 days.   predniSONE  10 MG tablet Commonly known as: DELTASONE  Take 40 mg daily for 1 day, 30 mg daily for 1 day, 20 mg daily for 1 days,10 mg daily for 1 day, then stop What changed:  medication strength how much to take how to take this when to take this additional  instructions   SYMBICORT  IN Inhale into the lungs.        Follow-up Information     Gretta Lacinda Browning, MD. Schedule an appointment as soon as possible for a visit in 1 week(s).   Specialty: Family Medicine Contact information: [address]  [address] [place] [address] [phone]                Allergies  Allergen Reactions   Hydrocodone  Other (See Comments)    Psychosis/hallucinations/sleep walking.  Other Reaction(s): Altered mental status, Other (See Comments)  Psychosis/hallucinations/sleep walking.   Psychosis/hallucinations/sleep walking.   Statins Other (See Comments)    Other Reaction(s): Myalgias, Myalgias (intolerance)   Ezetimibe  Other (See Comments)    -Gas     Other Procedures/Studies: ECHOCARDIOGRAM COMPLETE Result Date: 03/19/2023    ECHOCARDIOGRAM REPORT   Patient Name:   Lisa Zamora Date of Exam: 03/19/2023 Medical Rec #:  [id number]    Height:       63.0 in Accession #:    [id number]   Weight:       136.0 lb Date of Birth:  January 31, 1956     BSA:          1.641 m Patient Age:    67 years     BP:           159/73 mmHg Patient Gender: F            HR:           95 bpm. Exam Location:  Inpatient Procedure: 2D Echo, Cardiac Doppler and Color Doppler Indications:    R06.02 SOB  History:        Patient has no prior history of Echocardiogram examinations.                 COPD, Signs/Symptoms:Shortness of Breath, Dyspnea and Altered                 Mental Status; Risk Factors:Hypertension, Diabetes, Sleep Apnea                 and Dyslipidemia. FLU positive.  Sonographer:    Ellouise Mose RDCS Referring Phys: [id number] [facility]  Sonographer Comments: Suboptimal parasternal window. Patient coughing throughout exam, high fowler's position due to dyspnea. IMPRESSIONS  1. There is turbulent flow in the LV outflow tract due to the hyperdynamic state, without true obstruction. Left ventricular ejection fraction, by estimation, is 70 to 75%. The left ventricle  has hyperdynamic function. The left ventricle has no regional  wall motion abnormalities. Left ventricular diastolic parameters are consistent  with Grade II diastolic dysfunction (pseudonormalization). Elevated left atrial pressure.  2. Right ventricular systolic function is hyperdynamic. The right ventricular size is normal.  3. Left atrial size was mildly dilated.  4. Right atrial size was mildly dilated.  5. Splay artifact perhaps underestimates mitral regurgitation severity. . The mitral valve is degenerative. Mild to moderate mitral valve regurgitation. Moderate mitral annular calcification.  6. The aortic valve is grossly normal. There is mild calcification of the aortic valve. There is mild thickening of the aortic valve. Aortic valve regurgitation is not visualized. Aortic valve sclerosis/calcification is present, without any evidence of aortic stenosis.  7. The inferior vena cava is normal in size with greater than 50% respiratory variability, suggesting right atrial pressure of 3 mmHg. Comparison(s): No prior Echocardiogram. FINDINGS  Left Ventricle: There is turbulent flow in the LV outflow tract due to the hyperdynamic state, without true obstruction. Left ventricular ejection fraction, by estimation, is 70 to 75%. The left ventricle has hyperdynamic function. The left ventricle has no regional wall motion abnormalities. The left ventricular internal cavity size was small. There is no left ventricular hypertrophy. Left ventricular diastolic parameters are consistent with Grade II diastolic dysfunction (pseudonormalization). Elevated left atrial pressure. Right Ventricle: The right ventricular size is normal. No increase in right ventricular wall thickness. Right ventricular systolic function is hyperdynamic. Left Atrium: Left atrial size was mildly dilated. Right Atrium: Right atrial size was mildly dilated. Pericardium: There is no evidence of pericardial effusion. Mitral Valve: Splay artifact perhaps  underestimates mitral regurgitation severity. The mitral valve is degenerative in appearance. Moderate mitral annular calcification. Mild to moderate mitral valve regurgitation. Tricuspid Valve: The tricuspid valve is normal in structure. Tricuspid valve regurgitation is not demonstrated. No evidence of tricuspid stenosis. Aortic Valve: The aortic valve is grossly normal. There is mild calcification of the aortic valve. There is mild thickening of the aortic valve. Aortic valve regurgitation is not visualized. Aortic valve sclerosis/calcification is present, without any evidence of aortic stenosis. Pulmonic Valve: The pulmonic valve was normal in structure. Pulmonic valve regurgitation is not visualized. No evidence of pulmonic stenosis. Aorta: The aortic root and ascending aorta are structurally normal, with no evidence of dilitation. Venous: The inferior vena cava is normal in size with greater than 50% respiratory variability, suggesting right atrial pressure of 3 mmHg. IAS/Shunts: No atrial level shunt detected by color flow Doppler.  LEFT VENTRICLE PLAX 2D LVIDd:         4.90 cm     Diastology LVIDs:         2.80 cm     LV e' medial:    6.85 cm/s LV PW:         1.00 cm     LV E/e' medial:  18.7 LV IVS:        0.90 cm     LV e' lateral:   7.07 cm/s LVOT diam:     2.00 cm     LV E/e' lateral: 18.1 LV SV:         72 LV SV Index:   44 LVOT Area:     3.14 cm  LV Volumes (MOD) LV vol d, MOD A2C: 41.4 ml LV vol d, MOD A4C: 30.7 ml LV vol s, MOD A2C: 11.5 ml LV vol s, MOD A4C: 9.8 ml LV SV MOD A2C:     29.9 ml LV SV MOD A4C:     30.7 ml LV SV MOD BP:      25.8 ml  RIGHT VENTRICLE             IVC RV S prime:     13.40 cm/s  IVC diam: 2.10 cm TAPSE (M-mode): 2.6 cm LEFT ATRIUM             Index        RIGHT ATRIUM           Index LA diam:        3.60 cm 2.19 cm/m   RA Area:     10.30 cm LA Vol (A2C):   48.9 ml 29.79 ml/m  RA Volume:   20.10 ml  12.25 ml/m LA Vol (A4C):   37.0 ml 22.54 ml/m LA Biplane Vol: 42.8 ml  26.08 ml/m  AORTIC VALVE LVOT Vmax:   147.00 cm/s LVOT Vmean:  94.300 cm/s LVOT VTI:    0.228 m  AORTA Ao Root diam: 2.50 cm Ao Asc diam:  2.50 cm MITRAL VALVE MV Area (PHT): 5.52 cm     SHUNTS MV Decel Time: 138 msec     Systemic VTI:  0.23 m MV E velocity: 128.00 cm/s  Systemic Diam: 2.00 cm MV A velocity: 133.50 cm/s MV E/A ratio:  0.96 Mihai Croitoru MD Electronically signed by Jerel Balding MD Signature Date/Time: 03/19/2023/11:20:16 AM    Final    DG Chest Port 1V same Day Result Date: 03/18/2023 CLINICAL DATA:  Shortness of breath EXAM: PORTABLE CHEST 1 VIEW COMPARISON:  03/16/2023 FINDINGS: There is hyperinflation of the lungs compatible with COPD. Heart and mediastinal contours within normal limits. Aortic atherosclerosis. No confluent opacities or effusions. IMPRESSION: Hyperinflation/COPD.  No active disease. Electronically Signed   By: Franky Crease M.D.   On: 03/18/2023 10:48   DG Chest Port 1 View Result Date: 03/16/2023 CLINICAL DATA:  Shortness of breath. EXAM: PORTABLE CHEST 1 VIEW COMPARISON:  01/22/2022 and CT chest 12/18/2009. FINDINGS: Trachea is midline. Heart is at the upper limits of normal in size, stable. Thoracic aorta is calcified. Minimal streaky scarring in the left lung base. Calcified granulomas. Lungs are otherwise clear. No pleural fluid. IMPRESSION: No acute findings. Electronically Signed   By: Newell Eke M.D.   On: 03/16/2023 16:13     TODAY-DAY OF DISCHARGE:  Subjective:   Lisa Zamora today has no headache,no chest abdominal pain,no new weakness tingling or numbness, feels much better wants to go home today.   Objective:   Blood pressure (!) 178/88, pulse 90, temperature 97.6 F (36.4 C), resp. rate (!) 23, height 5' 3 (1.6 m), weight 61.7 kg, SpO2 98%.  Intake/Output Summary (Last 24 hours) at 03/21/2023 0947 Last data filed at 03/20/2023 1600 Gross per 24 hour  Intake 3 ml  Output 650 ml  Net -647 ml   Filed Weights   03/16/23 1522  Weight: 61.7  kg    Exam: Awake Alert, Oriented *3, No new F.N deficits, Normal affect [place].AT,PERRAL Supple Neck,No JVD, No cervical lymphadenopathy appriciated.  Symmetrical Chest wall movement, Good air movement bilaterally, CTAB RRR,No Gallops,Rubs or new Murmurs, No Parasternal Heave +ve B.Sounds, Abd Soft, Non tender, No organomegaly appriciated, No rebound -guarding or rigidity. No Cyanosis, Clubbing or edema, No new Rash or bruise   PERTINENT RADIOLOGIC STUDIES: ECHOCARDIOGRAM COMPLETE Result Date: 03/19/2023    ECHOCARDIOGRAM REPORT   Patient Name:   Lisa Zamora Date of Exam: 03/19/2023 Medical Rec #:  [id number]    Height:       63.0 in Accession #:    [id number]   Weight:  136.0 lb Date of Birth:  02/06/1956     BSA:          1.641 m Patient Age:    67 years     BP:           159/73 mmHg Patient Gender: F            HR:           95 bpm. Exam Location:  Inpatient Procedure: 2D Echo, Cardiac Doppler and Color Doppler Indications:    R06.02 SOB  History:        Patient has no prior history of Echocardiogram examinations.                 COPD, Signs/Symptoms:Shortness of Breath, Dyspnea and Altered                 Mental Status; Risk Factors:Hypertension, Diabetes, Sleep Apnea                 and Dyslipidemia. FLU positive.  Sonographer:    Ellouise Mose RDCS Referring Phys: [id number] [facility]  Sonographer Comments: Suboptimal parasternal window. Patient coughing throughout exam, high fowler's position due to dyspnea. IMPRESSIONS  1. There is turbulent flow in the LV outflow tract due to the hyperdynamic state, without true obstruction. Left ventricular ejection fraction, by estimation, is 70 to 75%. The left ventricle has hyperdynamic function. The left ventricle has no regional  wall motion abnormalities. Left ventricular diastolic parameters are consistent with Grade II diastolic dysfunction (pseudonormalization). Elevated left atrial pressure.  2. Right ventricular systolic function is hyperdynamic. The  right ventricular size is normal.  3. Left atrial size was mildly dilated.  4. Right atrial size was mildly dilated.  5. Splay artifact perhaps underestimates mitral regurgitation severity. . The mitral valve is degenerative. Mild to moderate mitral valve regurgitation. Moderate mitral annular calcification.  6. The aortic valve is grossly normal. There is mild calcification of the aortic valve. There is mild thickening of the aortic valve. Aortic valve regurgitation is not visualized. Aortic valve sclerosis/calcification is present, without any evidence of aortic stenosis.  7. The inferior vena cava is normal in size with greater than 50% respiratory variability, suggesting right atrial pressure of 3 mmHg. Comparison(s): No prior Echocardiogram. FINDINGS  Left Ventricle: There is turbulent flow in the LV outflow tract due to the hyperdynamic state, without true obstruction. Left ventricular ejection fraction, by estimation, is 70 to 75%. The left ventricle has hyperdynamic function. The left ventricle has no regional wall motion abnormalities. The left ventricular internal cavity size was small. There is no left ventricular hypertrophy. Left ventricular diastolic parameters are consistent with Grade II diastolic dysfunction (pseudonormalization). Elevated left atrial pressure. Right Ventricle: The right ventricular size is normal. No increase in right ventricular wall thickness. Right ventricular systolic function is hyperdynamic. Left Atrium: Left atrial size was mildly dilated. Right Atrium: Right atrial size was mildly dilated. Pericardium: There is no evidence of pericardial effusion. Mitral Valve: Splay artifact perhaps underestimates mitral regurgitation severity. The mitral valve is degenerative in appearance. Moderate mitral annular calcification. Mild to moderate mitral valve regurgitation. Tricuspid Valve: The tricuspid valve is normal in structure. Tricuspid valve regurgitation is not demonstrated. No  evidence of tricuspid stenosis. Aortic Valve: The aortic valve is grossly normal. There is mild calcification of the aortic valve. There is mild thickening of the aortic valve. Aortic valve regurgitation is not visualized. Aortic valve sclerosis/calcification is present, without any evidence of aortic stenosis.  Pulmonic Valve: The pulmonic valve was normal in structure. Pulmonic valve regurgitation is not visualized. No evidence of pulmonic stenosis. Aorta: The aortic root and ascending aorta are structurally normal, with no evidence of dilitation. Venous: The inferior vena cava is normal in size with greater than 50% respiratory variability, suggesting right atrial pressure of 3 mmHg. IAS/Shunts: No atrial level shunt detected by color flow Doppler.  LEFT VENTRICLE PLAX 2D LVIDd:         4.90 cm     Diastology LVIDs:         2.80 cm     LV e' medial:    6.85 cm/s LV PW:         1.00 cm     LV E/e' medial:  18.7 LV IVS:        0.90 cm     LV e' lateral:   7.07 cm/s LVOT diam:     2.00 cm     LV E/e' lateral: 18.1 LV SV:         72 LV SV Index:   44 LVOT Area:     3.14 cm  LV Volumes (MOD) LV vol d, MOD A2C: 41.4 ml LV vol d, MOD A4C: 30.7 ml LV vol s, MOD A2C: 11.5 ml LV vol s, MOD A4C: 9.8 ml LV SV MOD A2C:     29.9 ml LV SV MOD A4C:     30.7 ml LV SV MOD BP:      25.8 ml RIGHT VENTRICLE             IVC RV S prime:     13.40 cm/s  IVC diam: 2.10 cm TAPSE (M-mode): 2.6 cm LEFT ATRIUM             Index        RIGHT ATRIUM           Index LA diam:        3.60 cm 2.19 cm/m   RA Area:     10.30 cm LA Vol (A2C):   48.9 ml 29.79 ml/m  RA Volume:   20.10 ml  12.25 ml/m LA Vol (A4C):   37.0 ml 22.54 ml/m LA Biplane Vol: 42.8 ml 26.08 ml/m  AORTIC VALVE LVOT Vmax:   147.00 cm/s LVOT Vmean:  94.300 cm/s LVOT VTI:    0.228 m  AORTA Ao Root diam: 2.50 cm Ao Asc diam:  2.50 cm MITRAL VALVE MV Area (PHT): 5.52 cm     SHUNTS MV Decel Time: 138 msec     Systemic VTI:  0.23 m MV E velocity: 128.00 cm/s  Systemic Diam: 2.00  cm MV A velocity: 133.50 cm/s MV E/A ratio:  0.96 Mihai Croitoru MD Electronically signed by Jerel Balding MD Signature Date/Time: 03/19/2023/11:20:16 AM    Final      PERTINENT LAB RESULTS: CBC: Recent Labs    03/19/23 0436  WBC 7.9  HGB 13.3  HCT 41.7  PLT 277   CMET CMP     Component Value Date/Time   NA 137 03/19/2023 0436   K 4.3 03/19/2023 0436   CL 94 (L) 03/19/2023 0436   CO2 31 03/19/2023 0436   GLUCOSE 149 (H) 03/19/2023 0436   BUN 19 03/19/2023 0436   CREATININE 0.92 03/19/2023 0436   CALCIUM 9.3 03/19/2023 0436   PROT 6.5 03/18/2023 0453   ALBUMIN 3.2 (L) 03/18/2023 0453   AST 50 (H) 03/18/2023 0453   ALT 47 (H) 03/18/2023 0453  ALKPHOS 57 03/18/2023 0453   BILITOT 0.5 03/18/2023 0453   GFRNONAA >60 03/19/2023 0436    GFR Estimated Creatinine Clearance: 49.1 mL/min (by C-G formula based on SCr of 0.92 mg/dL). No results for input(s): LIPASE, AMYLASE in the last 72 hours. No results for input(s): CKTOTAL, CKMB, CKMBINDEX, TROPONINI in the last 72 hours. Invalid input(s): POCBNP No results for input(s): DDIMER in the last 72 hours. No results for input(s): HGBA1C in the last 72 hours. No results for input(s): CHOL, HDL, LDLCALC, TRIG, CHOLHDL, LDLDIRECT in the last 72 hours. No results for input(s): TSH, T4TOTAL, T3FREE, THYROIDAB in the last 72 hours.  Invalid input(s): FREET3 No results for input(s): VITAMINB12, FOLATE, FERRITIN, TIBC, IRON, RETICCTPCT in the last 72 hours. Coags: No results for input(s): INR in the last 72 hours.  Invalid input(s): PT Microbiology: Recent Results (from the past 240 hours)  Resp panel by RT-PCR (RSV, Flu A&B, Covid) Anterior Nasal Swab     Status: Abnormal   Collection Time: 03/16/23  3:48 PM   Specimen: Anterior Nasal Swab  Result Value Ref Range Status   SARS Coronavirus 2 by RT PCR NEGATIVE NEGATIVE Final    Comment: (NOTE) SARS-CoV-2 target nucleic acids  are NOT DETECTED.  The SARS-CoV-2 RNA is generally detectable in upper respiratory specimens during the acute phase of infection. The lowest concentration of SARS-CoV-2 viral copies this assay can detect is 138 copies/mL. A negative result does not preclude SARS-Cov-2 infection and should not be used as the sole basis for treatment or other patient management decisions. A negative result may occur with  improper specimen collection/handling, submission of specimen other than nasopharyngeal swab, presence of viral mutation(s) within the areas targeted by this assay, and inadequate number of viral copies(<138 copies/mL). A negative result must be combined with clinical observations, patient history, and epidemiological information. The expected result is Negative.  Fact Sheet for Patients:  bloggercourse.com  Fact Sheet for Healthcare Providers:  seriousbroker.it  This test is no t yet approved or cleared by the [place]  FDA and  has been authorized for detection and/or diagnosis of SARS-CoV-2 by FDA under an Emergency Use Authorization (EUA). This EUA will remain  in effect (meaning this test can be used) for the duration of the COVID-19 declaration under Section 564(b)(1) of the Act, 21 U.S.C.section 360bbb-3(b)(1), unless the authorization is terminated  or revoked sooner.       Influenza A by PCR POSITIVE (A) NEGATIVE Final   Influenza B by PCR NEGATIVE NEGATIVE Final    Comment: (NOTE) The Xpert Xpress SARS-CoV-2/FLU/RSV plus assay is intended as an aid in the diagnosis of influenza from Nasopharyngeal swab specimens and should not be used as a sole basis for treatment. Nasal washings and aspirates are unacceptable for Xpert Xpress SARS-CoV-2/FLU/RSV testing.  Fact Sheet for Patients: bloggercourse.com  Fact Sheet for Healthcare Providers: seriousbroker.it  This test  is not yet approved or cleared by the [place]  FDA and has been authorized for detection and/or diagnosis of SARS-CoV-2 by FDA under an Emergency Use Authorization (EUA). This EUA will remain in effect (meaning this test can be used) for the duration of the COVID-19 declaration under Section 564(b)(1) of the Act, 21 U.S.C. section 360bbb-3(b)(1), unless the authorization is terminated or revoked.     Resp Syncytial Virus by PCR NEGATIVE NEGATIVE Final    Comment: (NOTE) Fact Sheet for Patients: bloggercourse.com  Fact Sheet for Healthcare Providers: seriousbroker.it  This test is not yet approved or cleared by  the [place]  FDA and has been authorized for detection and/or diagnosis of SARS-CoV-2 by FDA under an Emergency Use Authorization (EUA). This EUA will remain in effect (meaning this test can be used) for the duration of the COVID-19 declaration under Section 564(b)(1) of the Act, 21 U.S.C. section 360bbb-3(b)(1), unless the authorization is terminated or revoked.  Performed at [facility], [address] [place], [place] [address]   Respiratory (~20 pathogens) panel by PCR     Status: Abnormal   Collection Time: 03/18/23  3:15 PM   Specimen: Nasopharyngeal Swab; Respiratory  Result Value Ref Range Status   Adenovirus NOT DETECTED NOT DETECTED Final   Coronavirus 229E NOT DETECTED NOT DETECTED Final    Comment: (NOTE) The Coronavirus on the Respiratory Panel, DOES NOT test for the novel  Coronavirus (2019 nCoV)    Coronavirus HKU1 NOT DETECTED NOT DETECTED Final   Coronavirus NL63 NOT DETECTED NOT DETECTED Final   Coronavirus OC43 NOT DETECTED NOT DETECTED Final   Metapneumovirus NOT DETECTED NOT DETECTED Final   Rhinovirus / Enterovirus NOT DETECTED NOT DETECTED Final   Influenza A H1 2009 DETECTED (A) NOT DETECTED Final   Influenza B NOT DETECTED NOT DETECTED Final   Parainfluenza Virus 1 NOT  DETECTED NOT DETECTED Final   Parainfluenza Virus 2 NOT DETECTED NOT DETECTED Final   Parainfluenza Virus 3 NOT DETECTED NOT DETECTED Final   Parainfluenza Virus 4 NOT DETECTED NOT DETECTED Final   Respiratory Syncytial Virus NOT DETECTED NOT DETECTED Final   Bordetella pertussis NOT DETECTED NOT DETECTED Final   Bordetella Parapertussis NOT DETECTED NOT DETECTED Final   Chlamydophila pneumoniae NOT DETECTED NOT DETECTED Final   Mycoplasma pneumoniae NOT DETECTED NOT DETECTED Final    Comment: Performed at [facility], [address] [place], [place] [address]    FURTHER DISCHARGE INSTRUCTIONS:  Get Medicines reviewed and adjusted: Please take all your medications with you for your next visit with your Primary MD  Laboratory/radiological data: Please request your Primary MD to go over all hospital tests and procedure/radiological results at the follow up, please ask your Primary MD to get all Hospital records sent to his/her office.  In some cases, they will be blood work, cultures and biopsy results pending at the time of your discharge. Please request that your primary care M.D. goes through all the records of your hospital data and follows up on these results.  Also Note the following: If you experience worsening of your admission symptoms, develop shortness of breath, life threatening emergency, suicidal or homicidal thoughts you must seek medical attention immediately by calling 911 or calling your MD immediately  if symptoms less severe.  You must read complete instructions/literature along with all the possible adverse reactions/side effects for all the Medicines you take and that have been prescribed to you. Take any new Medicines after you have completely understood and accpet all the possible adverse reactions/side effects.   Do not drive when taking Pain medications or sleeping medications (Benzodaizepines)  Do not take more than prescribed Pain, Sleep and Anxiety  Medications. It is not advisable to combine anxiety,sleep and pain medications without talking with your primary care practitioner  Special Instructions: If you have smoked or chewed Tobacco  in the last 2 yrs please stop smoking, stop any regular Alcohol  and or any Recreational drug use.  Wear Seat belts while driving.  Please note: You were cared for by a hospitalist during your hospital stay. Once  you are discharged, your primary care physician will handle any further medical issues. Please note that NO REFILLS for any discharge medications will be authorized once you are discharged, as it is imperative that you return to your primary care physician (or establish a relationship with a primary care physician if you do not have one) for your post hospital discharge needs so that they can reassess your need for medications and monitor your lab values.  Total Time spent coordinating discharge including counseling, education and face to face time equals greater than 30 minutes.  SignedBETHA Donalda Applebaum 03/21/2023 9:47 AM

## 2023-03-21 NOTE — Progress Notes (Signed)
 Lisa Zamora to be D/C'd home per MD order. Discussed with the patient and all questions fully answered. TOC meds picked up from pharmacy by Layman, RN. Skin clean, dry and intact without evidence of skin break down, no evidence of skin tears noted.  IV catheters discontinued intact. Site without signs and symptoms of complications. Dressing and pressure applied.  An After Visit Summary was printed and given to the patient.  Patient escorted via WC, and D/C home via private auto.  [name]  03/21/2023

## 2023-03-26 ENCOUNTER — Other Ambulatory Visit (HOSPITAL_COMMUNITY): Payer: Self-pay

## 2023-06-09 ENCOUNTER — Other Ambulatory Visit: Payer: Self-pay

## 2023-06-09 ENCOUNTER — Emergency Department (HOSPITAL_BASED_OUTPATIENT_CLINIC_OR_DEPARTMENT_OTHER)

## 2023-06-09 ENCOUNTER — Emergency Department (HOSPITAL_BASED_OUTPATIENT_CLINIC_OR_DEPARTMENT_OTHER)
Admission: EM | Admit: 2023-06-09 | Discharge: 2023-06-09 | Disposition: A | Discharge status: Home / Self Care | Attending: Emergency Medicine | Admitting: Emergency Medicine

## 2023-06-09 ENCOUNTER — Encounter (HOSPITAL_BASED_OUTPATIENT_CLINIC_OR_DEPARTMENT_OTHER): Payer: Self-pay | Admitting: Emergency Medicine

## 2023-06-09 DIAGNOSIS — S40011A Contusion of right shoulder, initial encounter: Secondary | ICD-10-CM | POA: Diagnosis not present

## 2023-06-09 DIAGNOSIS — Z7982 Long term (current) use of aspirin: Secondary | ICD-10-CM | POA: Insufficient documentation

## 2023-06-09 DIAGNOSIS — W19XXXA Unspecified fall, initial encounter: Secondary | ICD-10-CM | POA: Insufficient documentation

## 2023-06-09 DIAGNOSIS — Z79899 Other long term (current) drug therapy: Secondary | ICD-10-CM | POA: Diagnosis not present

## 2023-06-09 DIAGNOSIS — S29011A Strain of muscle and tendon of front wall of thorax, initial encounter: Secondary | ICD-10-CM | POA: Insufficient documentation

## 2023-06-09 DIAGNOSIS — Z7902 Long term (current) use of antithrombotics/antiplatelets: Secondary | ICD-10-CM | POA: Insufficient documentation

## 2023-06-09 DIAGNOSIS — S29019A Strain of muscle and tendon of unspecified wall of thorax, initial encounter: Secondary | ICD-10-CM

## 2023-06-09 DIAGNOSIS — S40911A Unspecified superficial injury of right shoulder, initial encounter: Secondary | ICD-10-CM | POA: Diagnosis present

## 2023-06-09 MED ORDER — TRAMADOL HCL 50 MG PO TABS
100.0000 mg | ORAL_TABLET | Freq: Once | ORAL | Status: AC
  Administered 2023-06-09: 100 mg via ORAL
  Filled 2023-06-09: qty 2

## 2023-06-09 MED ORDER — LIDOCAINE 5 % EX PTCH
1.0000 | MEDICATED_PATCH | CUTANEOUS | Status: DC
  Administered 2023-06-09: 1 via TRANSDERMAL
  Filled 2023-06-09: qty 1

## 2023-06-09 MED ORDER — TRAMADOL HCL 50 MG PO TABS: ORAL_TABLET | ORAL | 0 refills | Status: AC

## 2023-06-09 MED ORDER — ACETAMINOPHEN 500 MG PO TABS
1000.0000 mg | ORAL_TABLET | Freq: Once | ORAL | Status: AC
  Administered 2023-06-09: 1000 mg via ORAL
  Filled 2023-06-09: qty 2
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
